# Patient Record
Sex: Male | Born: 1935 | Race: Black or African American | Hispanic: No | Marital: Married | Smoking: Former smoker
Health system: Southern US, Community
[De-identification: ages and names within clinical notes are randomized; demographics above are authoritative.]

## PROBLEM LIST (undated history)

## (undated) DIAGNOSIS — T7840XA Allergy, unspecified, initial encounter: Secondary | ICD-10-CM

## (undated) DIAGNOSIS — K219 Gastro-esophageal reflux disease without esophagitis: Secondary | ICD-10-CM

## (undated) DIAGNOSIS — E785 Hyperlipidemia, unspecified: Secondary | ICD-10-CM

## (undated) DIAGNOSIS — I10 Essential (primary) hypertension: Secondary | ICD-10-CM

## (undated) DIAGNOSIS — I2510 Atherosclerotic heart disease of native coronary artery without angina pectoris: Secondary | ICD-10-CM

## (undated) DIAGNOSIS — E119 Type 2 diabetes mellitus without complications: Secondary | ICD-10-CM

## (undated) HISTORY — DX: Allergy, unspecified, initial encounter: T78.40XA

## (undated) HISTORY — DX: Type 2 diabetes mellitus without complications: E11.9

## (undated) HISTORY — DX: Essential (primary) hypertension: I10

## (undated) HISTORY — DX: Hyperlipidemia, unspecified: E78.5

## (undated) HISTORY — DX: Atherosclerotic heart disease of native coronary artery without angina pectoris: I25.10

## (undated) HISTORY — DX: Gastro-esophageal reflux disease without esophagitis: K21.9

## (undated) HISTORY — PX: MRI: SHX5353

---

## 2000-09-15 HISTORY — PX: CORONARY ARTERY BYPASS GRAFT: SHX141

## 2001-09-15 HISTORY — PX: OTHER SURGICAL HISTORY: SHX169

## 2002-03-22 ENCOUNTER — Encounter: Payer: Self-pay | Admitting: Family Medicine

## 2006-09-02 ENCOUNTER — Ambulatory Visit: Payer: Self-pay | Admitting: Family Medicine

## 2006-09-22 ENCOUNTER — Ambulatory Visit: Payer: Self-pay | Admitting: Family Medicine

## 2006-12-02 ENCOUNTER — Ambulatory Visit: Payer: Self-pay | Admitting: Family Medicine

## 2006-12-02 LAB — CONVERTED CEMR LAB
AST: 20 units/L (ref 0–37)
Hgb A1c MFr Bld: 6.5 % — ABNORMAL HIGH (ref 4.6–6.0)
Microalb Creat Ratio: 2.6 mg/g (ref 0.0–30.0)

## 2007-04-27 ENCOUNTER — Encounter: Payer: Self-pay | Admitting: Family Medicine

## 2007-04-27 DIAGNOSIS — K219 Gastro-esophageal reflux disease without esophagitis: Secondary | ICD-10-CM

## 2007-04-27 DIAGNOSIS — E119 Type 2 diabetes mellitus without complications: Secondary | ICD-10-CM | POA: Insufficient documentation

## 2007-04-27 DIAGNOSIS — I10 Essential (primary) hypertension: Secondary | ICD-10-CM | POA: Insufficient documentation

## 2007-04-27 DIAGNOSIS — E785 Hyperlipidemia, unspecified: Secondary | ICD-10-CM | POA: Insufficient documentation

## 2007-04-27 DIAGNOSIS — J309 Allergic rhinitis, unspecified: Secondary | ICD-10-CM | POA: Insufficient documentation

## 2007-04-27 DIAGNOSIS — I2510 Atherosclerotic heart disease of native coronary artery without angina pectoris: Secondary | ICD-10-CM | POA: Insufficient documentation

## 2007-04-27 DIAGNOSIS — E669 Obesity, unspecified: Secondary | ICD-10-CM

## 2007-05-03 ENCOUNTER — Ambulatory Visit: Payer: Self-pay | Admitting: Family Medicine

## 2007-05-03 LAB — CONVERTED CEMR LAB: Cholesterol, target level: 200 mg/dL

## 2007-05-06 ENCOUNTER — Ambulatory Visit: Payer: Self-pay | Admitting: Family Medicine

## 2007-05-06 LAB — CONVERTED CEMR LAB
ALT: 13 units/L (ref 0–53)
AST: 17 units/L (ref 0–37)
Albumin: 3.9 g/dL (ref 3.5–5.2)
Calcium: 9.2 mg/dL (ref 8.4–10.5)
Chloride: 102 meq/L (ref 96–112)
GFR calc non Af Amer: 48 mL/min
Hgb A1c MFr Bld: 7 % — ABNORMAL HIGH (ref 4.6–6.0)
LDL Cholesterol: 62 mg/dL (ref 0–99)
VLDL: 16 mg/dL (ref 0–40)

## 2007-05-12 ENCOUNTER — Encounter: Payer: Self-pay | Admitting: Family Medicine

## 2007-06-15 ENCOUNTER — Encounter: Payer: Self-pay | Admitting: Family Medicine

## 2007-07-30 ENCOUNTER — Ambulatory Visit: Payer: Self-pay | Admitting: Family Medicine

## 2007-08-03 ENCOUNTER — Ambulatory Visit: Payer: Self-pay | Admitting: Family Medicine

## 2007-08-03 DIAGNOSIS — N189 Chronic kidney disease, unspecified: Secondary | ICD-10-CM

## 2007-09-13 ENCOUNTER — Encounter: Payer: Self-pay | Admitting: Family Medicine

## 2007-11-03 ENCOUNTER — Ambulatory Visit: Payer: Self-pay | Admitting: Family Medicine

## 2007-11-09 ENCOUNTER — Ambulatory Visit: Payer: Self-pay | Admitting: Family Medicine

## 2007-11-10 LAB — CONVERTED CEMR LAB
ALT: 15 units/L (ref 0–53)
AST: 22 units/L (ref 0–37)
Bilirubin, Direct: 0.2 mg/dL (ref 0.0–0.3)
CO2: 25 meq/L (ref 19–32)
Calcium: 9 mg/dL (ref 8.4–10.5)
Chloride: 102 meq/L (ref 96–112)
GFR calc non Af Amer: 69 mL/min
Glucose, Bld: 151 mg/dL — ABNORMAL HIGH (ref 70–99)
Hgb A1c MFr Bld: 6.3 % — ABNORMAL HIGH (ref 4.6–6.0)

## 2007-11-17 ENCOUNTER — Encounter: Payer: Self-pay | Admitting: Family Medicine

## 2008-02-01 ENCOUNTER — Ambulatory Visit: Payer: Self-pay | Admitting: Family Medicine

## 2008-02-01 LAB — CONVERTED CEMR LAB: Hgb A1c MFr Bld: 6 % (ref 4.6–6.0)

## 2008-02-03 ENCOUNTER — Ambulatory Visit: Payer: Self-pay | Admitting: Family Medicine

## 2008-02-16 ENCOUNTER — Ambulatory Visit: Payer: Self-pay | Admitting: Internal Medicine

## 2008-05-03 ENCOUNTER — Ambulatory Visit: Payer: Self-pay | Admitting: Family Medicine

## 2008-05-05 LAB — CONVERTED CEMR LAB
ALT: 17 units/L (ref 0–53)
Albumin: 3.7 g/dL (ref 3.5–5.2)
BUN: 14 mg/dL (ref 6–23)
CO2: 29 meq/L (ref 19–32)
Calcium: 9.3 mg/dL (ref 8.4–10.5)
Cholesterol: 126 mg/dL (ref 0–200)
Creatinine, Ser: 1.3 mg/dL (ref 0.4–1.5)
Creatinine,U: 167.9 mg/dL
Glucose, Bld: 171 mg/dL — ABNORMAL HIGH (ref 70–99)
HDL: 52.6 mg/dL (ref >39.0)
Microalb, Ur: 0.3 mg/dL (ref 0.0–1.9)
Total CHOL/HDL Ratio: 2.4
Total Protein: 7.3 g/dL (ref 6.0–8.3)
Triglycerides: 83 mg/dL (ref 0–149)

## 2008-05-09 ENCOUNTER — Ambulatory Visit: Payer: Self-pay | Admitting: Family Medicine

## 2008-07-13 ENCOUNTER — Ambulatory Visit: Payer: Self-pay | Admitting: Family Medicine

## 2008-08-08 ENCOUNTER — Ambulatory Visit: Payer: Self-pay | Admitting: Family Medicine

## 2008-08-15 ENCOUNTER — Ambulatory Visit: Payer: Self-pay | Admitting: Family Medicine

## 2008-09-11 ENCOUNTER — Telehealth: Payer: Self-pay | Admitting: Family Medicine

## 2008-10-12 ENCOUNTER — Telehealth: Payer: Self-pay | Admitting: Family Medicine

## 2008-11-24 ENCOUNTER — Telehealth: Payer: Self-pay | Admitting: Family Medicine

## 2008-11-30 ENCOUNTER — Telehealth: Payer: Self-pay | Admitting: Family Medicine

## 2008-12-14 ENCOUNTER — Telehealth: Payer: Self-pay | Admitting: Family Medicine

## 2009-02-14 ENCOUNTER — Ambulatory Visit: Payer: Self-pay | Admitting: Family Medicine

## 2009-02-14 LAB — CONVERTED CEMR LAB
AST: 20 units/L (ref 0–37)
Alkaline Phosphatase: 51 units/L (ref 39–117)
Calcium: 8.9 mg/dL (ref 8.4–10.5)
GFR calc non Af Amer: 54.17 mL/min (ref >60)
HDL: 66.9 mg/dL (ref >39.00)
Hgb A1c MFr Bld: 5.6 % (ref 4.6–6.5)
LDL Cholesterol: 54 mg/dL (ref 0–99)
Potassium: 4.1 meq/L (ref 3.5–5.1)
Sodium: 143 meq/L (ref 135–145)
Total Bilirubin: 0.9 mg/dL (ref 0.3–1.2)
VLDL: 15.6 mg/dL (ref 0.0–40.0)

## 2009-02-19 ENCOUNTER — Ambulatory Visit: Payer: Self-pay | Admitting: Family Medicine

## 2009-02-19 LAB — CONVERTED CEMR LAB
Bilirubin Urine: NEGATIVE
Blood in Urine, dipstick: NEGATIVE
Ketones, urine, test strip: NEGATIVE
Microalb Creat Ratio: 6.5 mg/g (ref 0.0–30.0)
Nitrite: NEGATIVE
Urobilinogen, UA: 0.2
pH: 7.5

## 2009-03-07 ENCOUNTER — Ambulatory Visit: Payer: Self-pay | Admitting: Family Medicine

## 2009-03-14 LAB — CONVERTED CEMR LAB
BUN: 19 mg/dL (ref 6–23)
CO2: 26 meq/L (ref 19–32)
Calcium: 8.8 mg/dL (ref 8.4–10.5)
Chloride: 105 meq/L (ref 96–112)
Creatinine, Ser: 1.8 mg/dL — ABNORMAL HIGH (ref 0.4–1.5)

## 2009-04-13 ENCOUNTER — Ambulatory Visit: Payer: Self-pay | Admitting: Family Medicine

## 2009-04-13 LAB — CONVERTED CEMR LAB
Albumin: 3.8 g/dL (ref 3.5–5.2)
BUN: 19 mg/dL (ref 6–23)
Calcium: 9.1 mg/dL (ref 8.4–10.5)
Creatinine, Ser: 1.8 mg/dL — ABNORMAL HIGH (ref 0.4–1.5)
Glucose, Bld: 122 mg/dL — ABNORMAL HIGH (ref 70–99)
Phosphorus: 3.5 mg/dL (ref 2.3–4.6)
Potassium: 3.9 meq/L (ref 3.5–5.1)

## 2009-07-27 ENCOUNTER — Telehealth: Payer: Self-pay | Admitting: Family Medicine

## 2009-07-27 DIAGNOSIS — F1010 Alcohol abuse, uncomplicated: Secondary | ICD-10-CM | POA: Insufficient documentation

## 2009-08-29 ENCOUNTER — Ambulatory Visit: Payer: Self-pay | Admitting: Family Medicine

## 2009-08-30 LAB — CONVERTED CEMR LAB
ALT: 12 units/L (ref 0–53)
BUN: 15 mg/dL (ref 6–23)
Basophils Absolute: 0 10*3/uL (ref 0.0–0.1)
Bilirubin, Direct: 0.1 mg/dL (ref 0.0–0.3)
Calcium: 9.3 mg/dL (ref 8.4–10.5)
Chloride: 102 meq/L (ref 96–112)
Cholesterol: 161 mg/dL (ref 0–200)
Creatinine, Ser: 1.5 mg/dL (ref 0.4–1.5)
Eosinophils Absolute: 0.3 10*3/uL (ref 0.0–0.7)
GFR calc non Af Amer: 58.27 mL/min (ref >60)
HDL: 75.8 mg/dL (ref >39.00)
Hemoglobin: 11.9 g/dL — ABNORMAL LOW (ref 13.0–17.0)
Hgb A1c MFr Bld: 5.5 % (ref 4.6–6.5)
LDL Cholesterol: 71 mg/dL (ref 0–99)
Lymphocytes Relative: 27.4 % (ref 12.0–46.0)
Lymphs Abs: 1.6 10*3/uL (ref 0.7–4.0)
MCHC: 35.5 g/dL (ref 30.0–36.0)
Neutro Abs: 3.5 10*3/uL (ref 1.4–7.7)
Platelets: 212 10*3/uL (ref 150.0–400.0)
RDW: 12 % (ref 11.5–14.6)
Total Bilirubin: 0.8 mg/dL (ref 0.3–1.2)
Total CHOL/HDL Ratio: 2
Triglycerides: 69 mg/dL (ref 0.0–149.0)

## 2009-09-13 ENCOUNTER — Ambulatory Visit: Payer: Self-pay | Admitting: Family Medicine

## 2009-09-18 ENCOUNTER — Ambulatory Visit: Payer: Self-pay | Admitting: Family Medicine

## 2009-09-27 ENCOUNTER — Ambulatory Visit: Payer: Self-pay | Admitting: Internal Medicine

## 2009-12-07 ENCOUNTER — Ambulatory Visit: Payer: Self-pay | Admitting: Family Medicine

## 2009-12-11 ENCOUNTER — Ambulatory Visit: Payer: Self-pay | Admitting: Family Medicine

## 2010-01-21 ENCOUNTER — Telehealth: Payer: Self-pay | Admitting: Family Medicine

## 2010-06-14 ENCOUNTER — Ambulatory Visit: Payer: Self-pay | Admitting: Family Medicine

## 2010-06-19 LAB — CONVERTED CEMR LAB
Albumin: 4.2 g/dL (ref 3.5–5.2)
BUN: 16 mg/dL (ref 6–23)
Calcium: 9.3 mg/dL (ref 8.4–10.5)
Cholesterol: 182 mg/dL (ref 0–200)
GFR calc non Af Amer: 72.44 mL/min (ref >60)
Glucose, Bld: 151 mg/dL — ABNORMAL HIGH (ref 70–99)
HDL: 91.8 mg/dL (ref >39.00)
LDL Cholesterol: 75 mg/dL (ref 0–99)
Sodium: 139 meq/L (ref 135–145)
Total Bilirubin: 1 mg/dL (ref 0.3–1.2)
VLDL: 14.8 mg/dL (ref 0.0–40.0)

## 2010-07-02 ENCOUNTER — Telehealth: Payer: Self-pay | Admitting: Family Medicine

## 2010-07-02 ENCOUNTER — Emergency Department: Payer: Self-pay | Admitting: Emergency Medicine

## 2010-07-05 ENCOUNTER — Ambulatory Visit: Payer: Self-pay | Admitting: Internal Medicine

## 2010-07-05 DIAGNOSIS — R55 Syncope and collapse: Secondary | ICD-10-CM

## 2010-10-15 NOTE — Assessment & Plan Note (Signed)
Summary: FLU SHOT/[facility]/CLE  Nurse Visit   Vital Signs:  Patient profile:   77 year old male Height:      66 inches Weight:      197 pounds  Vitals Entered By: Benny Lennert CMA (AAMA) (September 18, 2009 3:00 PM)  Allergies: 1)  ! * Blood Thinner  Orders Added: 1)  Admin 1st Vaccine [90471] 2)  Flu Vaccine 37yrs + [04540]    Flu Vaccine Consent Questions     Do you have a history of severe allergic reactions to this vaccine? no    Any prior history of allergic reactions to egg and/or gelatin? no    Do you have a sensitivity to the preservative Thimersol? no    Do you have a past history of Guillan-Barre Syndrome? no    Do you currently have an acute febrile illness? no    Have you ever had a severe reaction to latex? no    Vaccine information given and explained to patient? yes    Are you currently pregnant? no    Lot Number:[id number]   Exp Date:03/14/2010   Site Given  Left Deltoid IMu

## 2010-10-15 NOTE — Assessment & Plan Note (Signed)
Summary: rov   Visit Type:  Follow-up Primary Provider:  [name] MD  CC:  no complaints.  History of Present Illness: [name] is a delightful 77 year old male with a history of coronary artery disease.  He is status post bypass surgery in 2002 at [facility].  Also has a history of diabetes, hypertension and hyperlipidemia.  Had a stress Myoview in 2010 which was negative.    Returns today fro routine f/u. Doing well. Walking 1.5 miles a day (weather permitting) No CP or dyspnea. BP well controlled. Taking all medications without problems. Has cut way back on ETOH use.   Current Problems (verified): 1)  Alcohol Abuse  (ICD-305.00) 2)  Renal Insufficiency, Chronic  (ICD-585.9) 3)  Obesity  (ICD-278.00) 4)  Hypertension  (ICD-401.9) 5)  Hyperlipidemia  (ICD-272.4) 6)  Gerd  (ICD-530.81) 7)  Diabetes Mellitus, Type II  (ICD-250.00) 8)  Coronary Artery Disease  (ICD-414.00) 9)  Allergic Rhinitis  (ICD-477.9)  Current Medications (verified): 1)  Atenolol 50 Mg  Tabs (Atenolol) .... Take 1 Tablet By Mouth Two Times A Day 2)  Norvasc 5 Mg Tabs (Amlodipine Besylate) .... Take 1 Tablet By Mouth Once A Day 3)  Hydrochlorothiazide 25 Mg  Tabs (Hydrochlorothiazide) .... Take 1 Tablet By Mouth Once A Day 4)  Glipizide 5 Mg  Tb24 (Glipizide) .... Take 1 Tablet By Mouth Two Times A Day 5)  Simvastatin 40 Mg  Tabs (Simvastatin) .... Take 1 Tablet By Mouth Once A Day 6)  Isosorbide Dinitrate 10 Mg  Tabs (Isosorbide Dinitrate) .... Take 1 Tablet By Mouth Three Times A Day 7)  Adult Aspirin Ec Low Strength 81 Mg  Tbec (Aspirin) .... Once Daily 8)  [facility] Spectravite Senior   Tabs (Multiple Vitamins-Minerals) .Marland Kitchen.. 1 X Week 9)  Nexium 40 Mg Cpdr (Esomeprazole Magnesium) .... Take 1 Tablet By Mouth Once A Day 10)  Cozaar 100 Mg Tabs (Losartan Potassium) .... Take 1 Tablet By Mouth Once A Day  Allergies (verified): 1)  ! * Blood Thinner 2)  ! Metformin Hcl \  Past History:  Past Medical  History: Last updated: 04/27/2007 Allergic rhinitis Coronary artery disease Diabetes mellitus, type II GERD Hyperlipidemia Hypertension  Vital Signs:  Patient profile:   77 year old male Weight:      192.25 pounds Pulse rate:   66 / minute Pulse rhythm:   regular BP sitting:   116 / 62  (right arm)  Vitals Entered By: Charlena Cross, RN, BSN (September 27, 2009 3:56 PM)  Physical Exam  General:  Gen: well appearing. no resp difficulty HEENT: normal Neck: supple. no JVD. Carotids 2+ bilat; no bruits. No lymphadenopathy or thryomegaly appreciated. Cor: PMI nondisplaced. Regular rate & rhythm. No rubs, gallops, murmur. Lungs: clear Abdomen: soft, nontender, nondistended. Good bowel sounds. Extremities: no cyanosis, clubbing, rash, edema Neuro: alert & orientedx3, cranial nerves grossly intact. moves all 4 extremities w/o difficulty. affect pleasant    Impression & Recommendations:  Problem # 1:  CORONARY ARTERY DISEASE (ICD-414.00) Stable. No evidence of ischemia. Continue current regimen.  Problem # 2:  HYPERTENSION (ICD-401.9) Blood pressure well controlled. Continue current regimen.  Problem # 3:  HYPERLIPIDEMIA (ICD-272.4) Followed by Dr. Ermalene Searing. Goal LDL < 70. Continue simvastatin.

## 2010-10-15 NOTE — Progress Notes (Signed)
Summary: Prior Authorization Norvasc  Phone Note From Pharmacy Call back at ph [phone] fax [phone]   Caller: [facility] [address] [phone]* Call For: Dr. Ermalene Searing  Summary of Call: Received faxed form from pharmacy stating that PA is needed for Norvasc 5mg .  Called [employer] [facility] at [phone] and requested a PA form via automated voice system.  Linde Gillis CMA Duncan Dull)  Jan 21, 2010 9:36 AM   PA forms in your IN box.   Initial call taken by: Linde Gillis CMA Duncan Dull),  Jan 21, 2010 11:01 AM  Follow-up for Phone Call        Done Follow-up by: [name] MD,  Jan 22, 2010 8:09 AM     Appended Document: Prior Authorization Norvasc Received Denial for Norvasc.  Denial letter in your IN box.

## 2010-10-15 NOTE — Assessment & Plan Note (Signed)
Summary: ROV/AMD   Visit Type:  Follow-up Primary [facility] [place]:  [facility]  CC:  c/o breaking out in a sweat after eating on 06/25/10.  Denies chest pain or shortness of breath. He loss his balance with feeling weak. He went to the ED at [facility] for evaluation.[facility]  History of Present Illness: [name] is a delightful 77 year old male with a history of coronary artery disease.  He is status post bypass surgery in 2002 at [facility].  Also has a history of diabetes, hypertension and hyperlipidemia.  Had a stress Myoview in 2010 which was negative.    Returns today for routine f/u. Tuesday was in [place] with wife and began sweating and fell to floor. Denies CP or palpitations. No LOC. Went to ER. No apparent cause.    Doing well. Walking 1.5 miles a day (weather permitting) No CP or dyspnea. No palpitations.  BP well controlled. Denies orthostasis.  Taking all medications without problems. Has cut way back on ETOH use.   Current Medications (verified): 1)  Atenolol 50 Mg  Tabs (Atenolol) .... Take 1 Tablet By Mouth Two Times A Day 2)  Norvasc 5 Mg Tabs (Amlodipine Besylate) .... Take 1 Tablet By Mouth Once A Day 3)  Hydrochlorothiazide 25 Mg  Tabs (Hydrochlorothiazide) .... Take 1 Tablet By Mouth Once A Day 4)  Simvastatin 40 Mg  Tabs (Simvastatin) .... Take 1 Tablet By Mouth Once A Day 5)  Isosorbide Dinitrate 10 Mg  Tabs (Isosorbide Dinitrate) .... Take 1 Tablet By Mouth Three Times A Day 6)  [facility]vs Spectravite Senior   Tabs (Multiple Vitamins-Minerals) .Tom [facility].. 1 X Week 7)  Nexium 40 Mg Cpdr (Esomeprazole Magnesium) .... Take 1 Tablet By Mouth Once A Day 8)  Cozaar 100 Mg Tabs (Losartan Potassium) .... Take 1 Tablet By Mouth Once A Day 9)  Prodigy Autocode Blood Glucose  Devi (Blood Glucose Monitoring Suppl) .Tom [facility].. 1 Device. Check Cbgs Daily. Dx 250.00 10)  Prodigy Blood Glucose Test  Strp (Glucose Blood) .... Check Blood Sugar Daily. Dx 250.00 11)  Aspirin 325 Mg Tabs (Aspirin) .... One Tablet Once  Daily 12)  Cyclobenzaprine Hcl 5 Mg Tabs (Cyclobenzaprine Hcl) .... One Tablet Three Times A Day  Allergies (verified): 1)  ! * Blood Thinner 2)  ! Metformin Hcl  Past History:  Past Medical History: Last updated: 05/12/2007 Allergic rhinitis Coronary artery disease Diabetes mellitus, type II GERD Hyperlipidemia Hypertension  Past Surgical History: Last updated: May 12, 2007 2002   Triple CABG 2003    Cataract Surgery             MRI:  tooth abscess  Family History: Last updated: 2007-05-12 Father: Died 21. HTN, CAD, high chol. Mother: Died 18, arthritis, HTN Siblings: 2 brothers CAD, 1 sister CAD - MI age 18, another sister DM - MI age 25  Social History: Last updated: 12-May-2007 Former Smoker, quit 6 years ago Alcohol use-yes, occasionally Regular exercise-yes, walks daily, 1 mile Marital Status: Married x 51 years Children: 39 years old, healthy Occupation: Retired Paediatric nurse, farming Diet:  (+) fruit and veggies, (+) H2O  Risk Factors: Alcohol Use: <1 (09/13/2009) Exercise: yes (2007-05-12)  Risk Factors: Smoking Status: quit > 6 months (09/13/2009)  Review of Systems       As per HPI and past medical history; otherwise all systems negative.   Vital Signs:  Patient profile:   77 year old male Height:      66 inches Weight:      185 pounds  BMI:     29.97 Pulse rate:   67 / minute BP sitting:   100 / 62  (left arm) Cuff size:   regular  Vitals Entered By: Bishop Dublin, CMA (July 05, 2010 3:39 PM)  Physical Exam  General:  elderly well appearing. no resp difficulty HEENT:  except for poor dentition Neck: supple. no JVD. Carotids 2+ bilat; no bruits. No lymphadenopathy or thryomegaly appreciated. Cor: PMI nondisplaced. Regular rate & rhythm. No rubs, gallops, murmur. Lungs: clear Abdomen: soft, nontender, nondistended. Good bowel sounds. Extremities: no cyanosis, clubbing, rash, edema Neuro: alert & orientedx3, cranial nerves grossly  intact. moves all 4 extremities w/o difficulty. affect pleasant   Impression & Recommendations:  Problem # 1:  CORONARY ARTERY DISEASE (ICD-414.00) Stable. No evidence of ischemia. Continue current regimen.  Problem # 2:  PRESYNCOPE AND COLLAPSE (ICD-780.2) Suspect he may have been hypoglycemic vs arrhythmia. If episode recurs will need event monitor.   Patient Instructions: 1)  Your physician recommends that you continue on your current medications as directed. Please refer to the Current Medication list given to you today. 2)  Your physician wants you to follow-up in:   6 months You will receive a reminder letter in the mail two months in advance. If you don't receive a letter, please call our office to schedule the follow-up appointment.

## 2010-10-15 NOTE — Progress Notes (Signed)
Summary: pt fell  Phone Note Call from Patient   Caller: Daughter Dois Davenport Call For: Tom Nora MD Summary of Call: Daughter states pt fell a few minutes ago and is complaining of pain in his back and hips.  Advised her that pt should go to ER to be evaluated. Initial call taken by: Lowella Petties CMA,  July 02, 2010 2:36 PM

## 2010-10-15 NOTE — Assessment & Plan Note (Signed)
Summary: 3 month follow up/rbh   Vital Signs:  Patient profile:   77 year old male Height:      66 inches Weight:      193.2 pounds BMI:     31.30 Temp:     97.7 degrees F oral Pulse rate:   66 / minute Pulse rhythm:   regular BP sitting:   120 / 78  (left arm) Cuff size:   regular  Vitals Entered By: Benny Lennert CMA Duncan Dull) (December 11, 2009 11:09 AM)  History of Present Illness: Chief complaint 3 month followup also needs new glucose meter  DM, very well controlled. Will stop glipizide.  Rare low CBG <60. ..meter has broken.   Walks daily, moderate diet control. 3 oz of wine a week per patient.  Hypertension History:      He denies headache, chest pain, palpitations, peripheral edema, and side effects from treatment.  Well controlled at home. Marland Kitchen        Positive major cardiovascular risk factors include male age 68 years old or older, diabetes, hyperlipidemia, and hypertension.  Negative major cardiovascular risk factors include non-tobacco-user status.        Positive history for target organ damage include ASHD (either angina/prior MI/prior CABG).     Problems Prior to Update: 1)  Alcohol Abuse  (ICD-305.00) 2)  Renal Insufficiency, Chronic  (ICD-585.9) 3)  Obesity  (ICD-278.00) 4)  Hypertension  (ICD-401.9) 5)  Hyperlipidemia  (ICD-272.4) 6)  Gerd  (ICD-530.81) 7)  Diabetes Mellitus, Type II  (ICD-250.00) 8)  Coronary Artery Disease  (ICD-414.00) 9)  Allergic Rhinitis  (ICD-477.9)  Current Medications (verified): 1)  Atenolol 50 Mg  Tabs (Atenolol) .... Take 1 Tablet By Mouth Two Times A Day 2)  Norvasc 5 Mg Tabs (Amlodipine Besylate) .... Take 1 Tablet By Mouth Once A Day 3)  Hydrochlorothiazide 25 Mg  Tabs (Hydrochlorothiazide) .... Take 1 Tablet By Mouth Once A Day 4)  Simvastatin 40 Mg  Tabs (Simvastatin) .... Take 1 Tablet By Mouth Once A Day 5)  Isosorbide Dinitrate 10 Mg  Tabs (Isosorbide Dinitrate) .... Take 1 Tablet By Mouth Three Times A Day 6)  Adult  Aspirin Ec Low Strength 81 Mg  Tbec (Aspirin) .... Once Daily 7)  [facility]vs Spectravite Senior   Tabs (Multiple Vitamins-Minerals) .Marland Kitchen.. 1 X Week 8)  Nexium 40 Mg Cpdr (Esomeprazole Magnesium) .... Take 1 Tablet By Mouth Once A Day 9)  Cozaar 100 Mg Tabs (Losartan Potassium) .... Take 1 Tablet By Mouth Once A Day 10)  Prodigy Autocode Blood Glucose  Devi (Blood Glucose Monitoring Suppl) .Marland Kitchen.. 1 Device. Check Cbgs Daily. Dx 250.00 11)  Prodigy Blood Glucose Test  Strp (Glucose Blood) .... Check Blood Sugar Daily. Dx 250.00  Allergies: 1)  ! * Blood Thinner 2)  ! Metformin Hcl  Past History:  Past medical, surgical, family and social histories (including risk factors) reviewed, and no changes noted (except as noted below).  Past Medical History: Reviewed history from 04/27/2007 and no changes required. Allergic rhinitis Coronary artery disease Diabetes mellitus, type II GERD Hyperlipidemia Hypertension  Past Surgical History: Reviewed history from 04/27/2007 and no changes required. 2002   Triple CABG 2003    Cataract Surgery             MRI:  tooth abscess  Family History: Reviewed history from 04/27/2007 and no changes required. Father: Died 29. HTN, CAD, high chol. Mother: Died 41, arthritis, HTN Siblings: 2 brothers CAD, 1 sister CAD -  MI age 39, another sister DM - MI age 52  Social History: Reviewed history from 04/27/2007 and no changes required. Former Smoker, quit 6 years ago Alcohol use-yes, occasionally Regular exercise-yes, walks daily, 1 mile Marital Status: Married x 51 years Children: 82 years old, healthy Occupation: Retired Paediatric nurse, farming Diet:  (+) fruit and veggies, (+) H2O  Review of Systems General:  Denies fatigue and fever. CV:  Denies chest pain or discomfort. Resp:  Denies shortness of breath. GI:  Denies abdominal pain. GU:  Denies dysuria.  Physical Exam  General:  overweight appearing male in NAD Mouth:  Oral mucosa and oropharynx  without lesions or exudates.  Teeth in good repair. Neck:  no carotid bruit or thyromegaly no cervical or supraclavicular lymphadenopathy  Lungs:  Normal respiratory effort, chest expands symmetrically. Lungs are clear to auscultation, no crackles or wheezes. Heart:  Distant heart sounds. Normal rate and regular rhythm. S1 and S2 normal without gallop, murmur, click, rub or other extra sounds. Abdomen:  Bowel sounds positive,abdomen soft and non-tender without masses, organomegaly or hernias noted. Pulses:  R and L posterior tibial pulses are full and equal bilaterally  Extremities:  no edema  Diabetes Management Exam:    Foot Exam (with socks and/or shoes not present):       Sensory-Pinprick/Light touch:          Left medial foot (L-4): normal          Left dorsal foot (L-5): normal          Left lateral foot (S-1): normal          Right medial foot (L-4): normal          Right dorsal foot (L-5): normal          Right lateral foot (S-1): normal       Sensory-Monofilament:          Left foot: normal          Right foot: normal       Inspection:          Left foot: normal          Right foot: normal       Nails:          Left foot: thickened          Right foot: thickened   Impression & Recommendations:  Problem # 1:  HYPERTENSION (ICD-401.9) Well controlled. Continue current medication.  His updated medication list for this problem includes:    Atenolol 50 Mg Tabs (Atenolol) .Marland Kitchen... Take 1 tablet by mouth two times a day    Norvasc 5 Mg Tabs (Amlodipine besylate) .Marland Kitchen... Take 1 tablet by mouth once a day    Hydrochlorothiazide 25 Mg Tabs (Hydrochlorothiazide) .Marland Kitchen... Take 1 tablet by mouth once a day    Cozaar 100 Mg Tabs (Losartan potassium) .Marland Kitchen... Take 1 tablet by mouth once a day  Problem # 2:  DIABETES MELLITUS, TYPE II (ICD-250.00) Very well controlled.Marland Kitchenstop glipizide.  The following medications were removed from the medication list:    Glipizide 5 Mg Tb24 (Glipizide) .Marland Kitchen...  Take 1 tablet by mouth two times a day His updated medication list for this problem includes:    Adult Aspirin Ec Low Strength 81 Mg Tbec (Aspirin) ..... Once daily    Cozaar 100 Mg Tabs (Losartan potassium) .Marland Kitchen... Take 1 tablet by mouth once a day  Problem # 3:  CORONARY ARTERY DISEASE (ICD-414.00) Stable per cards.  His updated  medication list for this problem includes:    Atenolol 50 Mg Tabs (Atenolol) .Marland Kitchen... Take 1 tablet by mouth two times a day    Norvasc 5 Mg Tabs (Amlodipine besylate) .Marland Kitchen... Take 1 tablet by mouth once a day    Hydrochlorothiazide 25 Mg Tabs (Hydrochlorothiazide) .Marland Kitchen... Take 1 tablet by mouth once a day    Isosorbide Dinitrate 10 Mg Tabs (Isosorbide dinitrate) .Marland Kitchen... Take 1 tablet by mouth three times a day    Adult Aspirin Ec Low Strength 81 Mg Tbec (Aspirin) ..... Once daily    Cozaar 100 Mg Tabs (Losartan potassium) .Marland Kitchen... Take 1 tablet by mouth once a day  Complete Medication List: 1)  Atenolol 50 Mg Tabs (Atenolol) .... Take 1 tablet by mouth two times a day 2)  Norvasc 5 Mg Tabs (Amlodipine besylate) .... Take 1 tablet by mouth once a day 3)  Hydrochlorothiazide 25 Mg Tabs (Hydrochlorothiazide) .... Take 1 tablet by mouth once a day 4)  Simvastatin 40 Mg Tabs (Simvastatin) .... Take 1 tablet by mouth once a day 5)  Isosorbide Dinitrate 10 Mg Tabs (Isosorbide dinitrate) .... Take 1 tablet by mouth three times a day 6)  Adult Aspirin Ec Low Strength 81 Mg Tbec (Aspirin) .... Once daily 7)  [facility]vs Spectravite Senior Tabs (Multiple vitamins-minerals) .Marland Kitchen.. 1 x week 8)  Nexium 40 Mg Cpdr (Esomeprazole magnesium) .... Take 1 tablet by mouth once a day 9)  Cozaar 100 Mg Tabs (Losartan potassium) .... Take 1 tablet by mouth once a day 10)  Prodigy Autocode Blood Glucose Devi (Blood glucose monitoring suppl) .Marland Kitchen.. 1 device. check cbgs daily. dx 250.00 11)  Prodigy Blood Glucose Test Strp (Glucose blood) .... Check blood sugar daily. dx 250.00  Hypertension Assessment/Plan:       The patient's hypertensive risk group is category C: Target organ damage and/or diabetes.  Today's blood pressure is 120/78.  His blood pressure goal is < 130/80.  Patient Instructions: 1)  Stop glipizide.  2)  Follow Blood sugars and BP at home. Call if blood sugar trending up. 3)  BMP prior to visit, ICD-9: 250.00, 273.2 4)  Hepatic Panel prior to visit ICD-9:  5)  Lipid panel prior to visit ICD-9 :  6)  HgBA1c prior to visit  ICD-9:  7)  Please schedule a follow-up appointment in 6 months DM. Prescriptions: PRODIGY AUTOCODE BLOOD GLUCOSE  DEVI (BLOOD GLUCOSE MONITORING SUPPL) 1 device. Check CBGs daily. Dx 250.00  #1 x 0   Entered and Authorized by:   [name] MD   Signed by:   [name] MD on 12/11/2009   Method used:   Electronically to        [facility]. [address] [phone]* (retail)       [address] [place], [place]  [id number]       Ph: [phone]       Fax: [phone]   RxID:   [id number] PRODIGY BLOOD GLUCOSE TEST  STRP (GLUCOSE BLOOD) Check blood sugar daily. Dx 250.00  #100 x 11   Entered and Authorized by:   [name] MD   Signed by:   [name] MD on 12/11/2009   Method used:   Electronically to        [facility]. [address] [phone]* (retail)       [address] [place], [place]  [id number]       Ph:  [phone]       Fax: [phone]   RxID:   [id number] NORVASC 5 MG TABS (AMLODIPINE BESYLATE) Take 1 tablet by mouth once a day Brand medically necessary #30 Tablet x 11   Entered and Authorized by:   [name] MD   Signed by:   [name] MD on 12/11/2009   Method used:   Electronically to        [facility]. [address] [phone]* (retail)       [address] [place], [place]  [id number]       Ph: [phone]       Fax: [phone]   RxID:   [id number]   Current Allergies (reviewed today): ! * BLOOD THINNER ! METFORMIN HCL

## 2010-10-15 NOTE — Assessment & Plan Note (Signed)
Summary: 6 MTH FU/CLE   Vital Signs:  Patient profile:   77 year old male Height:      66 inches Weight:      180.0 pounds BMI:     29.16 Temp:     98.1 degrees F oral Pulse rate:   66 / minute Pulse rhythm:   regular BP sitting:   120 / 76  (left arm) Cuff size:   regular  Vitals Entered By: Benny Lennert CMA Duncan Dull) (June 14, 2010 8:12 AM)  History of Present Illness: Chief complaint 6 month follow up   DM, well controlled in past..now off all med..due for A1C.  HTn, well controlled on cozaar, HCTZ, norvasc, atenolol.  High  Chol : on simvastatin..Goal LDL <70 due for reeval.  CAD.Marland Kitchenno chest pain.  Sees Cardiology annually...Dr. Derl Barrow. ALst OV 09/2009 Had a stress Myoview in 2010 which was negative.    Walking 1 mile daily HAs lost 13 lbs since last OV..eating helathier foods.   Lipid Management History:      Positive NCEP/ATP III risk factors include male age 14 years old or older, diabetes, hypertension, and ASHD (either angina/prior MI/prior CABG).  Negative NCEP/ATP III risk factors include HDL cholesterol greater than 60 and non-tobacco-user status.        His compliance with the TLC diet is fair.     Problems Prior to Update: 1)  Alcohol Abuse  (ICD-305.00) 2)  Renal Insufficiency, Chronic  (ICD-585.9) 3)  Obesity  (ICD-278.00) 4)  Hypertension  (ICD-401.9) 5)  Hyperlipidemia  (ICD-272.4) 6)  Gerd  (ICD-530.81) 7)  Diabetes Mellitus, Type II  (ICD-250.00) 8)  Coronary Artery Disease  (ICD-414.00) 9)  Allergic Rhinitis  (ICD-477.9)  Current Medications (verified): 1)  Atenolol 50 Mg  Tabs (Atenolol) .... Take 1 Tablet By Mouth Two Times A Day 2)  Norvasc 5 Mg Tabs (Amlodipine Besylate) .... Take 1 Tablet By Mouth Once A Day 3)  Hydrochlorothiazide 25 Mg  Tabs (Hydrochlorothiazide) .... Take 1 Tablet By Mouth Once A Day 4)  Simvastatin 40 Mg  Tabs (Simvastatin) .... Take 1 Tablet By Mouth Once A Day 5)  Isosorbide Dinitrate 10 Mg  Tabs (Isosorbide  Dinitrate) .... Take 1 Tablet By Mouth Three Times A Day 6)  Adult Aspirin Ec Low Strength 81 Mg  Tbec (Aspirin) .... Once Daily 7)  [facility]vs Spectravite Senior   Tabs (Multiple Vitamins-Minerals) .Marland Kitchen.. 1 X Week 8)  Nexium 40 Mg Cpdr (Esomeprazole Magnesium) .... Take 1 Tablet By Mouth Once A Day 9)  Cozaar 100 Mg Tabs (Losartan Potassium) .... Take 1 Tablet By Mouth Once A Day 10)  Prodigy Autocode Blood Glucose  Devi (Blood Glucose Monitoring Suppl) .Marland Kitchen.. 1 Device. Check Cbgs Daily. Dx 250.00 11)  Prodigy Blood Glucose Test  Strp (Glucose Blood) .... Check Blood Sugar Daily. Dx 250.00  Allergies: 1)  ! * Blood Thinner 2)  ! Metformin Hcl  Past History:  Past medical, surgical, family and social histories (including risk factors) reviewed, and no changes noted (except as noted below).  Past Medical History: Reviewed history from 04/27/2007 and no changes required. Allergic rhinitis Coronary artery disease Diabetes mellitus, type II GERD Hyperlipidemia Hypertension  Past Surgical History: Reviewed history from 04/27/2007 and no changes required. 2002   Triple CABG 2003    Cataract Surgery             MRI:  tooth abscess  Family History: Reviewed history from 04/27/2007 and no changes required. Father: Died 21. HTN,  CAD, high chol. Mother: Died 78, arthritis, HTN Siblings: 2 brothers CAD, 1 sister CAD - MI age 60, another sister DM - MI age 66  Social History: Reviewed history from 04/27/2007 and no changes required. Former Smoker, quit 6 years ago Alcohol use-yes, occasionally Regular exercise-yes, walks daily, 1 mile Marital Status: Married x 51 years Children: 42 years old, healthy Occupation: Retired Paediatric nurse, farming Diet:  (+) fruit and veggies, (+) H2O  Review of Systems General:  Denies fatigue and fever. CV:  Denies chest pain or discomfort. Resp:  Denies shortness of breath. GI:  Denies abdominal pain, bloody stools, constipation, and diarrhea. GU:  Denies  dysuria.  Physical Exam  General:  elderly overweight appearing male in NAD Mouth:  Oral mucosa and oropharynx without lesions or exudates.  Teeth in good repair. Neck:  no carotid bruit or thyromegaly no cervical or supraclavicular lymphadenopathy  Lungs:  Normal respiratory effort, chest expands symmetrically. Lungs are clear to auscultation, no crackles or wheezes. Heart:  Distant heart sounds. Normal rate and regular rhythm. S1 and S2 normal without gallop, murmur, click, rub or other extra sounds. Abdomen:  Bowel sounds positive,abdomen soft and non-tender without masses, organomegaly or hernias noted. Pulses:  R and L posterior tibial pulses are full and equal bilaterally  Extremities:  no edema Skin:  Intact without suspicious lesions or rashes Psych:  Cognition and judgment appear intact. Alert and cooperative with normal attention span and concentration. No apparent delusions, illusions, hallucinations  Diabetes Management Exam:    Foot Exam (with socks and/or shoes not present):       Sensory-Pinprick/Light touch:          Left medial foot (L-4): normal          Left dorsal foot (L-5): normal          Left lateral foot (S-1): normal          Right medial foot (L-4): normal          Right dorsal foot (L-5): normal          Right lateral foot (S-1): normal       Sensory-Monofilament:          Left foot: normal          Right foot: normal       Inspection:          Left foot: normal          Right foot: normal       Nails:          Left foot: thickened          Right foot: thickened   Impression & Recommendations:  Problem # 1:  HYPERTENSION (ICD-401.9) Well controlled. Continue current medication.  His updated medication list for this problem includes:    Atenolol 50 Mg Tabs (Atenolol) .Marland Kitchen... Take 1 tablet by mouth two times a day    Norvasc 5 Mg Tabs (Amlodipine besylate) .Marland Kitchen... Take 1 tablet by mouth once a day    Hydrochlorothiazide 25 Mg Tabs (Hydrochlorothiazide)  .Marland Kitchen... Take 1 tablet by mouth once a day    Cozaar 100 Mg Tabs (Losartan potassium) .Marland Kitchen... Take 1 tablet by mouth once a day  BP today: 120/76 Prior BP: 120/78 (12/11/2009)  Prior 10 Yr Risk Heart Disease: N/A (05/03/2007)  Labs Reviewed: K+: 4.0 (08/29/2009) Creat: : 1.5 (08/29/2009)   Chol: 161 (08/29/2009)   HDL: 75.80 (08/29/2009)   LDL: 71 (08/29/2009)   TG: 69.0 (08/29/2009)  Problem # 2:  HYPERLIPIDEMIA (ICD-272.4) Due for reeval.  His updated medication list for this problem includes:    Simvastatin 40 Mg Tabs (Simvastatin) .Marland Kitchen... Take 1 tablet by mouth once a day  Labs Reviewed: SGOT: 19 (08/29/2009)   SGPT: 12 (08/29/2009)  Lipid Goals: Chol Goal: 200 (05/03/2007)   HDL Goal: 40 (05/03/2007)   LDL Goal: 70 (05/03/2007)   TG Goal: 150 (05/03/2007)  Prior 10 Yr Risk Heart Disease: N/A (05/03/2007)   HDL:75.80 (08/29/2009), 66.90 (02/14/2009)  LDL:71 (08/29/2009), 54 (02/14/2009)  Chol:161 (08/29/2009), 136 (02/14/2009)  Trig:69.0 (08/29/2009), 78.0 (02/14/2009)  Problem # 3:  DIABETES MELLITUS, TYPE II (ICD-250.00)  Well controlled  in past..due for reeval. Great job with weight loss.  His updated medication list for this problem includes:    Adult Aspirin Ec Low Strength 81 Mg Tbec (Aspirin) ..... Once daily    Cozaar 100 Mg Tabs (Losartan potassium) .Marland Kitchen... Take 1 tablet by mouth once a day  Orders: TLB-BMP (Basic Metabolic Panel-BMET) (80048-METABOL) TLB-Hepatic/Liver Function Pnl (80076-HEPATIC) TLB-Lipid Panel (80061-LIPID) TLB-A1C / Hgb A1C (Glycohemoglobin) (83036-A1C)  Labs Reviewed: Creat: 1.5 (08/29/2009)     Last Eye Exam: normal (03/15/2008) Reviewed HgBA1c results: 5.1 (12/07/2009)  5.5 (08/29/2009)  Problem # 4:  CORONARY ARTERY DISEASE (ICD-414.00) Refilled isosorbide. COntinue ASA. Assymptomatic. Follow up with Cards 09/2010. His updated medication list for this problem includes:    Atenolol 50 Mg Tabs (Atenolol) .Marland Kitchen... Take 1 tablet by mouth two  times a day    Norvasc 5 Mg Tabs (Amlodipine besylate) .Marland Kitchen... Take 1 tablet by mouth once a day    Hydrochlorothiazide 25 Mg Tabs (Hydrochlorothiazide) .Marland Kitchen... Take 1 tablet by mouth once a day    Isosorbide Dinitrate 10 Mg Tabs (Isosorbide dinitrate) .Marland Kitchen... Take 1 tablet by mouth three times a day    Adult Aspirin Ec Low Strength 81 Mg Tbec (Aspirin) ..... Once daily    Cozaar 100 Mg Tabs (Losartan potassium) .Marland Kitchen... Take 1 tablet by mouth once a day  Complete Medication List: 1)  Atenolol 50 Mg Tabs (Atenolol) .... Take 1 tablet by mouth two times a day 2)  Norvasc 5 Mg Tabs (Amlodipine besylate) .... Take 1 tablet by mouth once a day 3)  Hydrochlorothiazide 25 Mg Tabs (Hydrochlorothiazide) .... Take 1 tablet by mouth once a day 4)  Simvastatin 40 Mg Tabs (Simvastatin) .... Take 1 tablet by mouth once a day 5)  Isosorbide Dinitrate 10 Mg Tabs (Isosorbide dinitrate) .... Take 1 tablet by mouth three times a day 6)  Adult Aspirin Ec Low Strength 81 Mg Tbec (Aspirin) .... Once daily 7)  Cvs Spectravite Senior Tabs (Multiple vitamins-minerals) .Marland Kitchen.. 1 x week 8)  Nexium 40 Mg Cpdr (Esomeprazole magnesium) .... Take 1 tablet by mouth once a day 9)  Cozaar 100 Mg Tabs (Losartan potassium) .... Take 1 tablet by mouth once a day 10)  Prodigy Autocode Blood Glucose Devi (Blood glucose monitoring suppl) .Marland Kitchen.. 1 device. check cbgs daily. dx 250.00 11)  Prodigy Blood Glucose Test Strp (Glucose blood) .... Check blood sugar daily. dx 250.00  Other Orders: Flu Vaccine 83yrs + (45409) Admin 1st Vaccine (81191)  Lipid Assessment/Plan:      Based on NCEP/ATP III, the patient's risk factor category is "history of coronary disease, peripheral vascular disease, cerebrovascular disease, or aortic aneurysm along with either diabetes, current smoker, or LDL > 130 plus HDL < 40 plus triglycerides > 200".  The patient's lipid goals are as follows: Total cholesterol goal is 200;  LDL cholesterol goal is 70; HDL cholesterol  goal is 40; Triglyceride goal is 150.  His LDL cholesterol goal has been met.    Patient Instructions: 1)  Please schedule a follow-up appointment in 6 months  CPX. 2)  BMP prior to visit, ICD-9: 250.00 3)  Hepatic Panel prior to visit ICD-9:  4)  Lipid panel prior to visit ICD-9 :  5)  HgBA1c prior to visit  ICD-9:  6)  Urine Microalbumin prior to visit ICD-9 :  Prescriptions: ISOSORBIDE DINITRATE 10 MG  TABS (ISOSORBIDE DINITRATE) Take 1 tablet by mouth three times a day  #90 Tablet x 3   Entered and Authorized by:   [name] MD   Signed by:   [name] MD on 06/14/2010   Method used:   Electronically to        [facility]. [address] [phone]* (retail)       [address] [place], [place]  [id number]       Ph: [phone]       Fax: [phone]   RxID:   [id number]   Current Allergies (reviewed today): ! * BLOOD THINNER ! METFORMIN HCL   Immunizations Administered:  Influenza Vaccine # 1:    Vaccine Type: Fluvax 3+    Site: left deltoid    Mfr: GlaxoSmithKline    Dose: 0.5 ml    Route: IM    Given by: Benny Lennert CMA (AAMA)    Exp. Date: 03/15/2011    Lot #: [id number]    VIS given: 04/09/10 version given June 14, 2010.  Flu Vaccine Consent Questions:    Do you have a history of severe allergic reactions to this vaccine? no    Any prior history of allergic reactions to egg and/or gelatin? no    Do you have a sensitivity to the preservative Thimersol? no    Do you have a past history of Guillan-Barre Syndrome? no    Do you currently have an acute febrile illness? no    Have you ever had a severe reaction to latex? no    Vaccine information given and explained to patient? yes  Last Flu Vaccine:  Fluvax 3+ (09/18/2009 2:23:53 PM) Flu Vaccine Result Date:  06/14/2010 Flu Vaccine Result:  given Flu Vaccine Next Due:  1 yr PSA Next Due:  Not Indicated

## 2010-10-23 ENCOUNTER — Encounter: Payer: Self-pay | Admitting: Family Medicine

## 2010-10-23 LAB — HM SIGMOIDOSCOPY

## 2010-11-01 ENCOUNTER — Other Ambulatory Visit: Payer: Self-pay | Admitting: Family Medicine

## 2010-11-01 ENCOUNTER — Encounter: Payer: Self-pay | Admitting: Family Medicine

## 2010-11-01 ENCOUNTER — Ambulatory Visit (INDEPENDENT_AMBULATORY_CARE_PROVIDER_SITE_OTHER): Payer: Medicare Other | Admitting: Family Medicine

## 2010-11-01 ENCOUNTER — Ambulatory Visit (INDEPENDENT_AMBULATORY_CARE_PROVIDER_SITE_OTHER)
Admission: RE | Admit: 2010-11-01 | Discharge: 2010-11-01 | Disposition: A | Discharge status: Home / Self Care | Payer: Medicare Other | Source: Ambulatory Visit | Attending: Family Medicine | Admitting: Family Medicine

## 2010-11-01 DIAGNOSIS — R05 Cough: Secondary | ICD-10-CM

## 2010-11-01 DIAGNOSIS — M545 Low back pain, unspecified: Secondary | ICD-10-CM

## 2010-11-01 DIAGNOSIS — E119 Type 2 diabetes mellitus without complications: Secondary | ICD-10-CM

## 2010-11-01 DIAGNOSIS — R059 Cough, unspecified: Secondary | ICD-10-CM

## 2010-11-01 DIAGNOSIS — R5383 Other fatigue: Secondary | ICD-10-CM

## 2010-11-01 DIAGNOSIS — R5381 Other malaise: Secondary | ICD-10-CM

## 2010-11-01 DIAGNOSIS — F1010 Alcohol abuse, uncomplicated: Secondary | ICD-10-CM

## 2010-11-01 DIAGNOSIS — Z79899 Other long term (current) drug therapy: Secondary | ICD-10-CM

## 2010-11-01 LAB — CONVERTED CEMR LAB
Blood in Urine, dipstick: NEGATIVE
Ketones, urine, test strip: NEGATIVE
Urobilinogen, UA: 0.2
WBC Urine, dipstick: NEGATIVE

## 2010-11-01 LAB — BASIC METABOLIC PANEL
Calcium: 9.4 mg/dL (ref 8.4–10.5)
GFR: 66.75 mL/min (ref >60.00)
Glucose, Bld: 138 mg/dL — ABNORMAL HIGH (ref 70–99)
Sodium: 142 mEq/L (ref 135–145)

## 2010-11-01 LAB — HEPATIC FUNCTION PANEL
AST: 19 U/L (ref 0–37)
Alkaline Phosphatase: 56 U/L (ref 39–117)
Total Bilirubin: 0.6 mg/dL (ref 0.3–1.2)

## 2010-11-01 LAB — CBC WITH DIFFERENTIAL/PLATELET
Basophils Absolute: 0 10*3/uL (ref 0.0–0.1)
Hemoglobin: 13.5 g/dL (ref 13.0–17.0)
Lymphocytes Relative: 19.1 % (ref 12.0–46.0)
Monocytes Relative: 8 % (ref 3.0–12.0)
Platelets: 178 10*3/uL (ref 150.0–400.0)
RDW: 13.2 % (ref 11.5–14.6)

## 2010-11-01 LAB — HEMOGLOBIN A1C: Hgb A1c MFr Bld: 6.1 % (ref 4.6–6.5)

## 2010-11-06 NOTE — Assessment & Plan Note (Signed)
Summary: NOT FEELING WELL/CLE   SECURE HORIZONS   Vital Signs:  Patient profile:   77 year old male Weight:      175 pounds Temp:     97.6 degrees F oral Pulse rate:   72 / minute Pulse rhythm:   regular BP sitting:   130 / 78  (right arm) Cuff size:   regular  Vitals Entered By: Lowella Petties CMA, AAMA (November 01, 2010 8:34 AM) CC: Pain across lower back, cough.   History of Present Illness: 77 year old male is status post bypass surgery in 2002 at [facility].  Also has a history of diabetes, hypertension and hyperlipidemia.  Had a stress Myoview in 2010 which was negative.     Syncopal episode in 06/2010... went to [facility].  Had CT scan of head, EKG, labs etc... per pt's daughter everthing was nml.  Saw Dr. Ellis Parents in follow up... he though syncope was either hypoglycemia or ? arrythmia... Recommneded event montior if further issues.  Since then no furthter syncope.   Since then low back pain, off and on... bilateral lumbar spine.  No numbness or tingling in legs.  Walking slower. No weakness in legs or arms Slowed though process since 06/2011.   Cough ongoing in last 1-2 months.  Productive. No ear pain, no fever, no face pain. No smoking... quit 6 years ago...< 25 pack year history. No SOB.   Has lost 10 lbs in last 4 months.   Daughter at [facility] today.. privately says...pt  not eating much, drinking a lot of alcohol ( 1.5 a day),  (pt states only  a little alcohol...2 oz a day) Daguhter works in a lab.. say "he smells like a GI bleed to me." She does not live with pt. Dois Davenport [phone]  Problems Prior to Update: 1)  Weakness  (ICD-780.79) 2)  Cough  (ICD-786.2) 3)  Low Back Pain Syndrome  (ICD-724.2) 4)  Syncope and Collapse  (ICD-780.2) 5)  Alcohol Abuse  (ICD-305.00) 6)  Renal Insufficiency, Chronic  (ICD-585.9) 7)  Obesity  (ICD-278.00) 8)  Hypertension  (ICD-401.9) 9)  Hyperlipidemia  (ICD-272.4) 10)  Gerd  (ICD-530.81) 11)  Diabetes Mellitus, Type II   (ICD-250.00) 12)  Coronary Artery Disease  (ICD-414.00) 13)  Allergic Rhinitis  (ICD-477.9)  Current Medications (verified): 1)  Atenolol 50 Mg  Tabs (Atenolol) .... Take 1 Tablet By Mouth Two Times A Day 2)  Norvasc 5 Mg Tabs (Amlodipine Besylate) .... Take 1 Tablet By Mouth Once A Day 3)  Simvastatin 40 Mg  Tabs (Simvastatin) .... Take 1 Tablet By Mouth Once A Day 4)  Isosorbide Dinitrate 10 Mg  Tabs (Isosorbide Dinitrate) .... Take 1 Tablet By Mouth Three Times A Day 5)  Cvs Spectravite Senior   Tabs (Multiple Vitamins-Minerals) .Marland Kitchen.. 1 X Week 6)  Nexium 40 Mg Cpdr (Esomeprazole Magnesium) .... Take 1 Tablet By Mouth Once A Day 7)  Cozaar 100 Mg Tabs (Losartan Potassium) .... Take 1 Tablet By Mouth Once A Day 8)  Prodigy Autocode Blood Glucose  Devi (Blood Glucose Monitoring Suppl) .Marland Kitchen.. 1 Device. Check Cbgs Daily. Dx 250.00 9)  Prodigy Blood Glucose Test  Strp (Glucose Blood) .... Check Blood Sugar Daily. Dx 250.00 10)  Aspirin 325 Mg Tabs (Aspirin) .... One Tablet Once Daily  Allergies (verified): 1)  ! * Blood Thinner 2)  ! Metformin Hcl  Past History:  Past medical, surgical, family and social histories (including risk factors) reviewed, and no changes noted (except as noted below).  Past Medical History: Reviewed history from 04/27/2007 and no changes required. Allergic rhinitis Coronary artery disease Diabetes mellitus, type II GERD Hyperlipidemia Hypertension  Past Surgical History: Reviewed history from 04/27/2007 and no changes required. 2002   Triple CABG 2003    Cataract Surgery             MRI:  tooth abscess  Family History: Reviewed history from 04/27/2007 and no changes required. Father: Died 76. HTN, CAD, high chol. Mother: Died 42, arthritis, HTN Siblings: 2 brothers CAD, 1 sister CAD - MI age 69, another sister DM - MI age 54  Social History: Reviewed history from 04/27/2007 and no changes required. Former Smoker, quit 6 years ago Alcohol use-yes,  occasionally Regular exercise-yes, walks daily, 1 mile Marital Status: Married x 51 years Children: 59 years old, healthy Occupation: Retired Paediatric nurse, farming Diet:  (+) fruit and veggies, (+) H2O  Review of Systems General:  Denies fatigue and fever. CV:  Denies chest pain or discomfort. Resp:  Denies shortness of breath. GI:  Complains of constipation; denies abdominal pain, bloody stools, and diarrhea. GU:  Denies dysuria and hematuria. Psych:  Denies anxiety and depression.  Physical Exam  General:  ovERWEIHGT ELDERLY MALE in nad  Head:  no maxillary sinus ttp Ears:  External ear exam shows no significant lesions or deformities.  Otoscopic examination reveals clear canals, tympanic membranes are intact bilaterally without bulging, retraction, inflammation or discharge. Hearing is grossly normal bilaterally. Nose:  External nasal examination shows no deformity or inflammation. Nasal mucosa are pink and moist without lesions or exudates. Mouth:  poor dentition Neck:  no cervical or supraclavicular lymphadenopathy no carotid bruit or thyromegaly  Lungs:  Normal respiratory effort, chest expands symmetrically. Lungs are clear to auscultation, no crackles or wheezes. Heart:  Distant heart sounds. Normal rate and regular rhythm. S1 and S2 normal without gallop, murmur, click, rub or other extra sounds. Abdomen:  Bowel sounds positive,abdomen soft and non-tender without masses, organomegaly or hernias noted. Msk:  ttp over B lumbar paraspinous muscles, NO CVA tenderness no vertebral ttp   neg SLR, neg Faber's Pulses:  R and L posterior tibial pulses are full and equal bilaterally  Extremities:  no edema Neurologic:  No cranial nerve deficits noted. Station and gait are normal but slowed gait. Plantar reflexes are down-going bilaterally. DTRs are symmetrical throughout. Sensory, motor and coordinative functions appear intact. Skin:  Intact without suspicious lesions or  rashes Psych:  Oriented X3, normally interactive, good eye contact, not anxious appearing, and not depressed appearing.     Impression & Recommendations:  Problem # 1:  WEAKNESS (ICD-780.79)  And unintentional weight loss.  Nml neuro exam.  Unclear cause. Will eval for blood loss in stool.  Eval with labs in detail as below.   Orders: TLB-BMP (Basic Metabolic Panel-BMET) (80048-METABOL) TLB-CBC Platelet - w/Differential (85025-CBCD) TLB-Hepatic/Liver Function Pnl (80076-HEPATIC) TLB-TSH (Thyroid Stimulating Hormone) (84443-TSH) TLB-B12 + Folate Pnl (16109_60454-U98/JXB)  Problem # 2:  COUGH (ICD-786.2) No current URI symptoms...  but chronic cough with smoking history. Will eval with CXR.  Orders: T-2 View CXR (71020TC)  Problem # 3:  LOW BACK PAIN SYNDROME (ICD-724.2) ? MSK strain vs radiating pain from abdomen.  Will check UA. The following medications were removed from the medication list:    Cyclobenzaprine Hcl 5 Mg Tabs (Cyclobenzaprine hcl) ..... One tablet three times a day His updated medication list for this problem includes:    Aspirin 325 Mg Tabs (Aspirin) ..... One tablet  once daily  Orders: UA Dipstick W/ Micro (manual) (04540)  Problem # 4:  ALCOHOL ABUSE (ICD-305.00) Pt denies.Marland Kitchen daughter insits this is a chronic issue for pt.  Counseled pt to decresae alcohol intake... eat three meals a day. offered info for AA.. pt doesn't think he needs.   Problem # 5:  DIABETES MELLITUS, TYPE II (ICD-250.00) Previously well controlled.Moody Bruins withglucose and A1c.  His updated medication list for this problem includes:    Cozaar 100 Mg Tabs (Losartan potassium) .Marland Kitchen... Take 1 blet by mouth once a day    Aspirin 325 Mg Tabs (Aspirin) ..... One tablet once daily  Orders: TLB-A1C / Hgb A1C (Glycohemoglobin) (83036-A1C)  Complete Medication List: 1)  Atenolol 50 Mg Tabs (Atenolol) .... Take 1 tablet by mouth two times a day 2)  Norvasc 5 Mg Tabs (Amlodipine besylate)  .... Take 1 tablet by mouth once a day 3)  Simvastatin 40 Mg Tabs (Simvastatin) .... Take 1 tablet by mouth once a day 4)  Isosorbide Dinitrate 10 Mg Tabs (Isosorbide dinitrate) .... Take 1 tablet by mouth three times a day 5)  Cvs Spectravite Senior Tabs (Multiple vitamins-minerals) .Marland Kitchen.. 1 x week 6)  Nexium 40 Mg Cpdr (Esomeprazole magnesium) .... Take 1 tablet by mouth once a day 7)  Cozaar 100 Mg Tabs (Losartan potassium) .... Take 1 tablet by mouth once a day 8)  Prodigy Autocode Blood Glucose Devi (Blood glucose monitoring suppl) .Marland Kitchen.. 1 device. check cbgs daily. dx 250.00 9)  Prodigy Blood Glucose Test Strp (Glucose blood) .... Check blood sugar daily. dx 250.00 10)  Aspirin 325 Mg Tabs (Aspirin) .... One tablet once daily   Orders Added: 1)  UA Dipstick W/ Micro (manual) [81000] 2)  T-2 View CXR [71020TC] 3)  TLB-BMP (Basic Metabolic Panel-BMET) [80048-METABOL] 4)  TLB-CBC Platelet - w/Differential [85025-CBCD] 5)  TLB-Hepatic/Liver Function Pnl [80076-HEPATIC] 6)  TLB-TSH (Thyroid Stimulating Hormone) [84443-TSH] 7)  TLB-B12 + Folate Pnl [[id number]-B12/FOL] 8)  TLB-A1C / Hgb A1C (Glycohemoglobin) [83036-A1C] 9)  Est. Patient Level IV [98119]    Prior Medications (reviewed today): ATENOLOL 50 MG  TABS (ATENOLOL) Take 1 tablet by mouth two times a day NORVASC 5 MG TABS (AMLODIPINE BESYLATE) Take 1 tablet by mouth once a day SIMVASTATIN 40 MG  TABS (SIMVASTATIN) Take 1 tablet by mouth once a day ISOSORBIDE DINITRATE 10 MG  TABS (ISOSORBIDE DINITRATE) Take 1 tablet by mouth three times a day [facility] SPECTRAVITE SENIOR   TABS (MULTIPLE VITAMINS-MINERALS) 1 x week NEXIUM 40 MG CPDR (ESOMEPRAZOLE MAGNESIUM) Take 1 tablet by mouth once a day COZAAR 100 MG TABS (LOSARTAN POTASSIUM) Take 1 tablet by mouth once a day PRODIGY AUTOCODE BLOOD GLUCOSE  DEVI (BLOOD GLUCOSE MONITORING SUPPL) 1 device. Check CBGs daily. Dx 250.00 PRODIGY BLOOD GLUCOSE TEST  STRP (GLUCOSE BLOOD) Check blood sugar  daily. Dx 250.00 ASPIRIN 325 MG TABS (ASPIRIN) one tablet once daily Current Allergies (reviewed today): ! * BLOOD THINNER ! METFORMIN HCL  Laboratory Results   Urine Tests  Date/Time Received: November 01, 2010 9:27 AM  Date/Time Reported: November 01, 2010 9:27 AM   Routine Urinalysis   Color: yellow Appearance: Clear Glucose: negative   (Normal Range: Negative) Bilirubin: negative   (Normal Range: Negative) Ketone: negative   (Normal Range: Negative) Spec. Gravity: 1.010   (Normal Range: 1.003-1.035) Blood: negative   (Normal Range: Negative) pH: 7.5   (Normal Range: 5.0-8.0) Protein: negative   (Normal Range: Negative) Urobilinogen: 0.2   (Normal Range: 0-1) Nitrite: negative   (  Normal Range: Negative) Leukocyte Esterace: negative   (Normal Range: Negative)

## 2010-11-07 ENCOUNTER — Other Ambulatory Visit: Payer: Self-pay | Admitting: Family Medicine

## 2010-11-07 ENCOUNTER — Encounter (INDEPENDENT_AMBULATORY_CARE_PROVIDER_SITE_OTHER): Payer: Self-pay | Admitting: *Deleted

## 2010-11-07 ENCOUNTER — Other Ambulatory Visit: Payer: Medicare Other

## 2010-11-07 DIAGNOSIS — R5381 Other malaise: Secondary | ICD-10-CM

## 2010-11-07 DIAGNOSIS — R5383 Other fatigue: Secondary | ICD-10-CM

## 2010-11-08 ENCOUNTER — Encounter: Payer: Self-pay | Admitting: Family Medicine

## 2010-11-08 ENCOUNTER — Ambulatory Visit (INDEPENDENT_AMBULATORY_CARE_PROVIDER_SITE_OTHER): Payer: Medicare Other | Admitting: Family Medicine

## 2010-11-08 DIAGNOSIS — R5381 Other malaise: Secondary | ICD-10-CM

## 2010-11-08 DIAGNOSIS — F068 Other specified mental disorders due to known physiological condition: Secondary | ICD-10-CM

## 2010-11-08 DIAGNOSIS — M545 Low back pain: Secondary | ICD-10-CM

## 2010-11-08 DIAGNOSIS — F1027 Alcohol dependence with alcohol-induced persisting dementia: Secondary | ICD-10-CM | POA: Insufficient documentation

## 2010-11-08 DIAGNOSIS — R05 Cough: Secondary | ICD-10-CM

## 2010-11-12 NOTE — Assessment & Plan Note (Signed)
Summary: 1 week follow up- spirometry   Vital Signs:  Patient profile:   77 year old male Height:      66 inches Weight:      177 pounds BMI:     28.67 Temp:     98.3 degrees F oral Pulse rate:   72 / minute Pulse rhythm:   regular BP sitting:   110 / 60  (left arm) Cuff size:   regular CC: 1 week follow up   History of Present Illness: 77 year old male is status post bypass surgery in 2002 at [facility].  Also has a history of diabetes, hypertension and hyperlipidemia.  Had a stress Myoview in 2010 which was negative.    At last VISIT:  Syncopal episode in 06/2010... went to [facility].  Had CT scan of head, EKG, labs etc... per pt's daughter everthing was nml.  Saw Dr. Ellis Parents in follow up... he though syncope was either hypoglycemia or ? arrythmia... Recommended event montior if further issues.  Since then no furthter syncope.  Since then low back pain, off and on... bilateral lumbar spine.  No numbness or tingling in legs.  Walking slower. No weakness in legs or arms Slowed though process since 06/2011.  Cough ongoing in last 1-2 months.  Productive. No ear pain, no fever, no face pain. No smoking... quit 6 years ago...< 25 pack year history but about 5 years. No SOB.   Has lost 10 lbs in last 4 months.  Since last OV.. nml CXR, neg hemeoccult stool, no anemia, nml thyroid, nml B12,HgA1C stable No UA.  Pt reports back pain is not bothering him much. Minimal cough per pt .Marland Kitchenno SOB.   Problems Prior to Update: 1)  Dementia  (ICD-294.8) 2)  Weakness  (ICD-780.79) 3)  Cough  (ICD-786.2) 4)  Low Back Pain Syndrome  (ICD-724.2) 5)  Syncope and Collapse  (ICD-780.2) 6)  Alcohol Abuse  (ICD-305.00) 7)  Renal Insufficiency, Chronic  (ICD-585.9) 8)  Obesity  (ICD-278.00) 9)  Hypertension  (ICD-401.9) 10)  Hyperlipidemia  (ICD-272.4) 11)  Gerd  (ICD-530.81) 12)  Diabetes Mellitus, Type II  (ICD-250.00) 13)  Coronary Artery Disease  (ICD-414.00) 14)  Allergic Rhinitis   (ICD-477.9)  Current Medications (verified): 1)  Atenolol 50 Mg  Tabs (Atenolol) .... Take 1 Tablet By Mouth Two Times A Day 2)  Norvasc 5 Mg Tabs (Amlodipine Besylate) .... Take 1 Tablet By Mouth Once A Day 3)  Simvastatin 40 Mg  Tabs (Simvastatin) .... Take 1 Tablet By Mouth Once A Day 4)  Isosorbide Dinitrate 10 Mg  Tabs (Isosorbide Dinitrate) .... Take 1 Tablet By Mouth Three Times A Day 5)  Cvs Spectravite Senior   Tabs (Multiple Vitamins-Minerals) .Marland Kitchen.. 1 X Week 6)  Nexium 40 Mg Cpdr (Esomeprazole Magnesium) .... Take 1 Tablet By Mouth Once A Day 7)  Cozaar 100 Mg Tabs (Losartan Potassium) .... Take 1 Tablet By Mouth Once A Day 8)  Prodigy Autocode Blood Glucose  Devi (Blood Glucose Monitoring Suppl) .Marland Kitchen.. 1 Device. Check Cbgs Daily. Dx 250.00 9)  Prodigy Blood Glucose Test  Strp (Glucose Blood) .... Check Blood Sugar Daily. Dx 250.00 10)  Aspirin 325 Mg Tabs (Aspirin) .... One Tablet Once Daily  Allergies: 1)  ! * Blood Thinner 2)  ! Metformin Hcl  Past History:  Past medical, surgical, family and social histories (including risk factors) reviewed, and no changes noted (except as noted below).  Past Medical History: Reviewed history from 04/27/2007 and no changes required.  Allergic rhinitis Coronary artery disease Diabetes mellitus, type II GERD Hyperlipidemia Hypertension  Past Surgical History: Reviewed history from 04/27/2007 and no changes required. 2002   Triple CABG 2003    Cataract Surgery             MRI:  tooth abscess  Family History: Reviewed history from 04/27/2007 and no changes required. Father: Died 82. HTN, CAD, high chol. Mother: Died 52, arthritis, HTN Siblings: 2 brothers CAD, 1 sister CAD - MI age 3, another sister DM - MI age 47  Social History: Reviewed history from 04/27/2007 and no changes required. Former Smoker, quit 6 years ago Alcohol use-yes, occasionally Regular exercise-yes, walks daily, 1 mile Marital Status: Married x 51  years Children: 53 years old, healthy Occupation: Retired Paediatric nurse, farming Diet:  (+) fruit and veggies, (+) H2O  Review of Systems General:  Denies fatigue and fever. CV:  Denies chest pain or discomfort. Resp:  Denies shortness of breath. GI:  Denies abdominal pain. GU:  Denies dysuria.  Physical Exam  General:  overweight male in NAD  Mouth:  poor dentition Neck:  no cervical or supraclavicular lymphadenopathy no carotid bruit or thyromegaly  Lungs:  Normal respiratory effort, chest expands symmetrically. Lungs are clear to auscultation, no crackles or wheezes. Heart:  Distant heart sounds. Normal rate and regular rhythm. S1 and S2 normal without gallop, murmur, click, rub or other extra sounds. Abdomen:  Bowel sounds positive,abdomen soft and non-tender without masses, organomegaly or hernias noted. Pulses:  R and L posterior tibial pulses are full and equal bilaterally  Extremities:  no edema Neurologic:  5,4,3,0,0,2,1,3,1, 1,0 Refused to spell world backwards.  Poor short term memory and some decline in executive functioning finished high school MMSE 20-23/30 Skin:  Intact without suspicious lesions or rashes Psych:  Oriented X3, normally interactive, good eye contact, not anxious appearing, and not depressed appearing.     Impression & Recommendations:  Problem # 1:  WEAKNESS (ICD-780.79) Lab eval nml. No evidence of GI bleed. Per pt weakness is not bothersome to him. Pt continues to deny alcohol abuse.  No new cardiac issues.Marland Kitchen last seen Dr. Elray Mcgregor 06/2010.. stable EKG.  Problem # 2:  COUGH (ICD-786.2) CXR nml.Marland KitchenMarland KitchenSpirometry today showed: only mild obstruction. Per pt cough better.   Problem # 3:  LOW BACK PAIN SYNDROME (ICD-724.2) Improved per pt. Treat with heat, strethcing. Call if pain not continuing to improve.  His updated medication list for this problem includes:    Aspirin 325 Mg Tabs (Aspirin) ..... One tablet once daily  Problem # 4:   DEMENTIA (ICD-294.8) MMSE 20-23/30 Likely secondary to age and ETOH chronic use.   Discussed issues in detail with pt's daughter Dois Davenport...she states pt continues to drik heavily despite refusing here. He is agressive and confontational with other family members and his wife. She does state that he is not physically abusive.  Complete Medication List: 1)  Atenolol 50 Mg Tabs (Atenolol) .... Take 1 tablet by mouth two times a day 2)  Norvasc 5 Mg Tabs (Amlodipine besylate) .... Take 1 tablet by mouth once a day 3)  Simvastatin 40 Mg Tabs (Simvastatin) .... Take 1 tablet by mouth once a day 4)  Isosorbide Dinitrate 10 Mg Tabs (Isosorbide dinitrate) .... Take 1 tablet by mouth three times a day 5)  Cvs Spectravite Senior Tabs (Multiple vitamins-minerals) .Marland Kitchen.. 1 x week 6)  Nexium 40 Mg Cpdr (Esomeprazole magnesium) .... Take 1 tablet by mouth once a day 7)  Cozaar  100 Mg Tabs (Losartan potassium) .... Take 1 tablet by mouth once a day 8)  Prodigy Autocode Blood Glucose Devi (Blood glucose monitoring suppl) .Marland Kitchen.. 1 device. check cbgs daily. dx 250.00 9)  Prodigy Blood Glucose Test Strp (Glucose blood) .... Check blood sugar daily. dx 250.00 10)  Aspirin 325 Mg Tabs (Aspirin) .... One tablet once daily  Patient Instructions: 1)  Cancel march appt and labs... move both back 3 months.    Orders Added: 1)  Est. Patient Level IV [16109]    Current Allergies (reviewed today): ! * BLOOD THINNER ! METFORMIN HCL

## 2010-11-17 ENCOUNTER — Emergency Department (HOSPITAL_COMMUNITY): Payer: Medicare Other

## 2010-11-17 ENCOUNTER — Emergency Department (HOSPITAL_COMMUNITY)
Admission: EM | Admit: 2010-11-17 | Discharge: 2010-11-17 | Disposition: A | Discharge status: Home / Self Care | Payer: Medicare Other | Attending: Emergency Medicine | Admitting: Emergency Medicine

## 2010-11-17 DIAGNOSIS — M545 Low back pain, unspecified: Secondary | ICD-10-CM | POA: Insufficient documentation

## 2010-11-17 DIAGNOSIS — I10 Essential (primary) hypertension: Secondary | ICD-10-CM | POA: Insufficient documentation

## 2010-11-17 DIAGNOSIS — S335XXA Sprain of ligaments of lumbar spine, initial encounter: Secondary | ICD-10-CM | POA: Insufficient documentation

## 2010-11-17 DIAGNOSIS — Z951 Presence of aortocoronary bypass graft: Secondary | ICD-10-CM | POA: Insufficient documentation

## 2010-11-17 DIAGNOSIS — R32 Unspecified urinary incontinence: Secondary | ICD-10-CM | POA: Insufficient documentation

## 2010-11-17 DIAGNOSIS — Z79899 Other long term (current) drug therapy: Secondary | ICD-10-CM | POA: Insufficient documentation

## 2010-11-17 DIAGNOSIS — E119 Type 2 diabetes mellitus without complications: Secondary | ICD-10-CM | POA: Insufficient documentation

## 2010-11-17 DIAGNOSIS — E7800 Pure hypercholesterolemia, unspecified: Secondary | ICD-10-CM | POA: Insufficient documentation

## 2010-11-17 DIAGNOSIS — X58XXXA Exposure to other specified factors, initial encounter: Secondary | ICD-10-CM | POA: Insufficient documentation

## 2010-11-17 DIAGNOSIS — I2510 Atherosclerotic heart disease of native coronary artery without angina pectoris: Secondary | ICD-10-CM | POA: Insufficient documentation

## 2010-11-17 DIAGNOSIS — Z7982 Long term (current) use of aspirin: Secondary | ICD-10-CM | POA: Insufficient documentation

## 2010-11-17 LAB — COMPREHENSIVE METABOLIC PANEL
ALT: 11 U/L (ref 0–53)
AST: 42 U/L — ABNORMAL HIGH (ref 0–37)
CO2: 26 mEq/L (ref 19–32)
Calcium: 8.8 mg/dL (ref 8.4–10.5)
Creatinine, Ser: 1.36 mg/dL (ref 0.4–1.5)
GFR calc Af Amer: >60 mL/min (ref >60)
GFR calc non Af Amer: 51 mL/min — ABNORMAL LOW (ref >60)
Sodium: 134 mEq/L — ABNORMAL LOW (ref 135–145)
Total Protein: 6.6 g/dL (ref 6.0–8.3)

## 2010-11-17 LAB — URINALYSIS, ROUTINE W REFLEX MICROSCOPIC
Glucose, UA: NEGATIVE mg/dL
Ketones, ur: 15 mg/dL — AB
Leukocytes, UA: NEGATIVE
Nitrite: NEGATIVE
Specific Gravity, Urine: 1.027 (ref 1.005–1.030)
pH: 6 (ref 5.0–8.0)

## 2010-11-17 LAB — DIFFERENTIAL
Basophils Absolute: 0 10*3/uL (ref 0.0–0.1)
Lymphs Abs: 1.6 10*3/uL (ref 0.7–4.0)
Monocytes Absolute: 0.9 10*3/uL (ref 0.1–1.0)
Monocytes Relative: 9 % (ref 3–12)
Neutro Abs: 7.4 10*3/uL (ref 1.7–7.7)

## 2010-11-17 LAB — GLUCOSE, CAPILLARY: Glucose-Capillary: 161 mg/dL — ABNORMAL HIGH (ref 70–99)

## 2010-11-17 LAB — URINE MICROSCOPIC-ADD ON

## 2010-11-17 LAB — CBC
Hemoglobin: 11.8 g/dL — ABNORMAL LOW (ref 13.0–17.0)
MCH: 31 pg (ref 26.0–34.0)
MCHC: 35.5 g/dL (ref 30.0–36.0)

## 2010-11-23 ENCOUNTER — Encounter: Payer: Self-pay | Admitting: Family Medicine

## 2010-12-09 ENCOUNTER — Other Ambulatory Visit (INDEPENDENT_AMBULATORY_CARE_PROVIDER_SITE_OTHER): Payer: Medicare Other | Admitting: Family Medicine

## 2010-12-09 DIAGNOSIS — E785 Hyperlipidemia, unspecified: Secondary | ICD-10-CM

## 2010-12-09 DIAGNOSIS — E119 Type 2 diabetes mellitus without complications: Secondary | ICD-10-CM

## 2010-12-09 LAB — LIPID PANEL
HDL: 84.9 mg/dL (ref >39.00)
Total CHOL/HDL Ratio: 2
VLDL: 21.4 mg/dL (ref 0.0–40.0)

## 2010-12-09 LAB — MICROALBUMIN / CREATININE URINE RATIO
Creatinine,U: 148 mg/dL
Microalb, Ur: 1.3 mg/dL (ref 0.0–1.9)

## 2010-12-09 LAB — COMPREHENSIVE METABOLIC PANEL
ALT: 10 U/L (ref 0–53)
AST: 18 U/L (ref 0–37)
Creatinine, Ser: 1.1 mg/dL (ref 0.4–1.5)
Sodium: 137 mEq/L (ref 135–145)
Total Bilirubin: 1.1 mg/dL (ref 0.3–1.2)
Total Protein: 7.1 g/dL (ref 6.0–8.3)

## 2010-12-09 LAB — HEMOGLOBIN A1C: Hgb A1c MFr Bld: 6 % (ref 4.6–6.5)

## 2010-12-13 ENCOUNTER — Encounter: Payer: Self-pay | Admitting: Family Medicine

## 2010-12-27 ENCOUNTER — Telehealth: Payer: Self-pay | Admitting: *Deleted

## 2010-12-27 NOTE — Telephone Encounter (Signed)
Go ahead and work him in where you can... 30 min if available.

## 2010-12-27 NOTE — Telephone Encounter (Signed)
Pt's daughter called to report that pt's dementia has gotten worse and he is drinking a lot.  Daughter doesn't know what to do.  She wants him to come back in for appt, but he wont come in on his own, she wants [place] to call him and tell him he has appt.  She said he will show up if we set something up for him.

## 2010-12-27 NOTE — Telephone Encounter (Signed)
Please arrange a f/u appt with Dr. Ermalene Searing.  You may have to discuss with her next week about working him in

## 2010-12-30 NOTE — Telephone Encounter (Signed)
Patient advised and appt made

## 2011-01-07 ENCOUNTER — Ambulatory Visit (INDEPENDENT_AMBULATORY_CARE_PROVIDER_SITE_OTHER): Payer: Medicare Other | Admitting: Family Medicine

## 2011-01-07 ENCOUNTER — Encounter: Payer: Self-pay | Admitting: Family Medicine

## 2011-01-07 DIAGNOSIS — F068 Other specified mental disorders due to known physiological condition: Secondary | ICD-10-CM

## 2011-01-07 DIAGNOSIS — M545 Low back pain: Secondary | ICD-10-CM

## 2011-01-07 DIAGNOSIS — F1010 Alcohol abuse, uncomplicated: Secondary | ICD-10-CM

## 2011-01-07 NOTE — Progress Notes (Signed)
Subjective:    Patient ID: Tom Sanders, male    DOB: August 18, 1930, 77 y.o.   MRN: [id number]  HPI 77 year old alcoholic with worsening dementia presents with his daughter given progressive memory loss and anger issues.  Status post bypass surgery in 2002 at [facility]. Also has a history of diabetes, hypertension and hyperlipidemia. Had a stress Myoview in 2010 which was negative.  Syncopal episode in 06/2010... went to [facility].  Had CT scan of head, EKG, labs etc...  everthing was nml.  Saw Dr. Derl Barrow in follow up... he though syncope was either hypoglycemia or ? arrythmia  In 10/2010 Nml CXR, neg hemeoccult stool, no anemia, nml thyroid, nml B12,HgA1C stable  Nml UA.  Seen in ER on 3/9 for weakness, back pain , ? Slurred speech, that day seemed to be having issues moving right side of body,? Due to pain. Dx with lumbar strain.. Given percocet, plain films of spine showed DDD L 4-l5 Glucose 161, cbc showed Hg 11.8, nml UA, AST high, mildly low Na of 134.  Daughter has noted worsening gradually in last few weeks... Losing things more. Issues with short term memory. No further syncopal spells. No focal neuro changes. No numbness. No further slurred speech (difficulty finding words),  (no visual changes). Back pain continues intermittantly.. Did not fill Percocet due to concern of use with alcohol.  Pt sticks by report that he only has three ounces a day of ETOH.. States he understands need to decrease/stop ...but has not shown any inclination for cessation. AA offered but not really appropriate in this case.        Review of Systems  Constitutional: Negative for fever, fatigue and unexpected weight change.  HENT: Negative for ear pain, sore throat and trouble swallowing.   Eyes: Negative for pain.  Respiratory: Negative for cough, shortness of breath and wheezing.   Cardiovascular: Negative for chest pain, palpitations and leg swelling.  Gastrointestinal: Negative for nausea,  abdominal pain, diarrhea, constipation and blood in stool.  Genitourinary: Negative for dysuria, urgency, hematuria and difficulty urinating.  Skin: Negative for rash.  Neurological: Negative for syncope, weakness, light-headedness, numbness and headaches.  Psychiatric/Behavioral: Negative for behavioral problems and dysphoric mood. The patient is not nervous/anxious.        Objective:   Physical Exam  Constitutional: Vital signs are normal.       Obese appearing elderly male in NAD  HENT:  Head: Normocephalic. Head is without contusion.  Right Ear: Hearing, tympanic membrane, external ear and ear canal normal.  Left Ear: Hearing, tympanic membrane, external ear and ear canal normal.  Nose: Nose normal.  Mouth/Throat: Oropharynx is clear and moist and mucous membranes are normal. He does not have dentures. Abnormal dentition. Dental caries present. No uvula swelling.  Eyes: Conjunctivae, EOM and lids are normal. Pupils are equal, round, and reactive to light. No foreign bodies found.  Fundoscopic exam:      The right eye shows no arteriolar narrowing, no exudate and no papilledema.       The left eye shows no arteriolar narrowing, no exudate and no papilledema.  Neck: Trachea normal, normal range of motion and full passive range of motion without pain. Neck supple. Carotid bruit is not present. No mass and no thyromegaly present.  Cardiovascular: Normal rate, regular rhythm, normal heart sounds and normal pulses.   Pulmonary/Chest: Effort normal and breath sounds normal. No respiratory distress. He has no decreased breath sounds. He has no wheezes. He has no  rhonchi. He has no rales.  Abdominal: Soft. Normal appearance. There is no tenderness.  Neurological: He is alert. He has normal strength and normal reflexes. He is not disoriented. He displays no atrophy. No cranial nerve deficit or sensory deficit. He exhibits normal muscle tone. Coordination and gait abnormal.       slowed gait,  could not perform hand flip test  MMSE scanned in : score:18/30  Psychiatric: His mood appears not anxious. His speech is delayed. He is slowed. He is not agitated, not aggressive, not withdrawn and not combative. He expresses inappropriate judgment. He does not exhibit a depressed mood. He expresses no suicidal ideation. He exhibits abnormal recent memory. He exhibits normal remote memory.          Assessment & Plan:

## 2011-01-07 NOTE — Patient Instructions (Signed)
Stop by front desk to set up referrals.  Use tylenol for back pain and heat on low back.

## 2011-01-07 NOTE — Assessment & Plan Note (Addendum)
Consistent with degenerative disc disease, possible mild radiculopathy symptoms. NSAIDs and narcotics not recommended in this alcoholic. Use tylenol for pain...will begin by referral to PT.Marland Kitchen Unsure if he will keep this appt. Refer to back specialist for consideration of epidural injections if not improving.

## 2011-01-07 NOTE — Assessment & Plan Note (Addendum)
Worsening gradually but also some unusual acute symptoms of ? Slurred speech, right sided weakness, aphasia.  Also abnormal cerebellar exam. MMSE worsened from last check. Given negative work up so far (no sign of infection etc.) dementia is most likely related to chronic heavy alcohol use. Counseled pt to stop. Will eval new neuro symptoms with MRI brain to rule out stroke. Pt on aspirin daily. 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

## 2011-01-14 ENCOUNTER — Encounter: Payer: Medicare Other | Admitting: Family Medicine

## 2011-01-15 ENCOUNTER — Ambulatory Visit
Admission: RE | Admit: 2011-01-15 | Discharge: 2011-01-15 | Disposition: A | Discharge status: Home / Self Care | Payer: Medicare Other | Source: Ambulatory Visit | Attending: Family Medicine | Admitting: Family Medicine

## 2011-01-15 DIAGNOSIS — F068 Other specified mental disorders due to known physiological condition: Secondary | ICD-10-CM

## 2011-01-16 ENCOUNTER — Telehealth: Payer: Self-pay | Admitting: *Deleted

## 2011-01-16 NOTE — Telephone Encounter (Signed)
Crystal called to let you know that patient had an appt for PT this morning, but he no showed. Crystal called him to see if he was okay and if he wanted to reschedule and she spoke with patient's wife and per wife patient does not want to have Physical therapy.

## 2011-01-20 ENCOUNTER — Ambulatory Visit: Payer: Medicare Other | Admitting: Family Medicine

## 2011-01-21 NOTE — Telephone Encounter (Signed)
Aware. 

## 2011-01-28 NOTE — Assessment & Plan Note (Signed)
[facility] OFFICE NOTE   NAME:Helmes, BRADIE LACOCK                   MRN:          [id number]  DATE:02/16/2008                            DOB:          1930/05/17    REASON FOR CONSULTATION:  Coronary artery disease, establish a new  cardiologist.   Mr. Muscarella is a delightful 77 year old male with a history of  coronary artery disease.  He is status post bypass surgery in 2002 at  [facility].  Also has a history of diabetes, hypertension and hyperlipidemia.   He tells me that he has not had any problems with his heart since bypass  surgery in 2002.  In 08-Oct-2022 his brother died and he noticed some jaw  pain and went to [facility].  Apparently had a stress Myoview which was  negative and eventually found to have a tooth abscess.  This was taken  care of.   His diabetes has been followed quite closely by Dr. Ermalene Searing.  He is  fairly active.  He has not had any chest pain or shortness of breath.  No heart failure symptoms.   REVIEW OF SYSTEMS:  Notable for gastroesophageal reflux, some arm and  shoulder arthritis, constipation and allergies.  The remainder of the  review of systems is negative except for HPI and problem list.  For  detailed list, please see the intake form and the clinic note, which I  have reviewed.   PAST MEDICAL HISTORY:  1. Coronary artery disease.      a.     Status post bypass surgery at [facility] in 2002.      b.     Negative Myoview in January 24, 2009for jaw pain.  2. Hypertension.  3. Hyperlipidemia.  4. Diabetes.  5. Obesity.  6. Arthritis.  7. Gastroesophageal reflux disease.   CURRENT MEDICATIONS:  1. Amlodipine 5 mg a day.  2. Simvastatin 40 mg a day.  3. Glipizide 5 mg b.i.d.  4. Aspirin 81 mg a day.  5. Prevacid 30 mg a day.  6. Cozaar 100 mg a day.  7. Hydrochlorothiazide 25 mg a day.  8. Isosorbide dinitrate 10 mg t.i.d.  9. Atenolol 50 mg b.i.d.  10.Multivitamin.   ALLERGIES:  No known drug allergies.   SOCIAL HISTORY:  He is married.  He has 10 children.  He is retired.  He  used to work on a [employer].  He denies any current tobacco use.  He  did smoke a few cigarettes a day back in 1969.  No alcohol.   FAMILY HISTORY:  Two sisters died from heart disease in the past.  Mother died at 49 from heart trouble.  Father died at 20, he did have a  history of coronary artery disease.  He has a sister who is 60, who is  alive.   PHYSICAL EXAM:  He is well-appearing, in no acute distress.  Ambulates  around the clinic without any respiratory difficulty.  Blood pressure was 130/66, heart rate 70, weight 205.  HEENT:  Normal.  NECK:  Supple.  No JVD.  Carotids are  2+ bilaterally without any bruits.  There is no lymphadenopathy or thyromegaly.  CARDIAC:  PMI is nondisplaced.  Regular rate and rhythm.  No murmurs.  He has a soft S4.  LUNGS:  Clear.  ABDOMEN:  Obese, nontender.  No obvious hepatosplenomegaly.  No bruits,  no masses.  Good bowel sounds.  EXTREMITIES:  Warm with no cyanosis, clubbing or edema.  No rash.  NEUROLOGIC:  Alert and oriented x3.  Cranial nerves II-XII intact.  He  moves all four extremities without difficulty.  Affect is pleasant.   EKG shows sinus rhythm at a rate of 70.  There is a first-degree AV  block with a PR interval of 202 msec.   ASSESSMENT/PLAN:  1. Coronary artery disease is quite stable.  Continue current therapy.      We will attempt to get the results of his Myoview from [facility].  Can      consider stopping his isosorbide or switching to Imdur as needed to      simplify his medication regimen.  2. Hypertension.  Blood pressure is just a little bit on the upper end      of normal.  Consider increasing the amlodipine as necessary.  I      will leave this to Dr. Ermalene Searing.  3. Hyperlipidemia.  Followed by Dr. Ermalene Searing.  Goal LDL is less than      70.  Titrate statin as needed.  4. Obesity.  I recommended that he  continue a regular exercise      program.   DISPOSITION:  We will see him back on a 51-month basis for further follow-  up.     Bevelyn Buckles. Bensimhon, MD  Electronically Signed   DRB/MedQ  DD: 02/16/2008  DT: 02/16/2008  Job #: [id number]   cc:   [name], MD

## 2011-01-31 NOTE — Assessment & Plan Note (Signed)
[facility] OFFICE NOTE   NAME:Tom Sanders, Bahl                   MRN:          [id number]  DATE:09/02/2006                            DOB:          Dec 13, 1929    CHIEF COMPLAINT:  A 77 year old male here to establish new doctor.   HISTORY OF PRESENT ILLNESS:  [name] is changing his primary care  practice to be closer to home.  He was previously seen at [facility] following  a hospitalization there, but it is getting harder for him to drive that  far.  He has the following concerns:  1. Gastroesophageal reflux disease:  He states that this was well      controlled previously in the past on Nexium but because he should      only be on Nexium for a limited time, he was instructed to try      Prilosec.  When he went to the pharmacy he got a prescription for      omeprazole.  He is taking a total of 40 mg daily but states that      this is not helping.  He then went and bought Prilosec over-the-      counter and states that that worked significantly better for him.      He would like a prescription so that he can get Prilosec if      possible in the non-generic Prilosec form.  2. Hypertension, stable:  He states that his blood pressure has been      fairly well controlled on multiple blood pressure medications      including Cozaar, Atenolol, amlodipine, hydrochlorothiazide.  He      denies chest pain or shortness of breath.  3. Diabetes, stable:  He states his blood sugars range fasting in the      morning between 120-135.  He thinks his last hemoglobin A1c was      checked in October 2007 and was approximately 7.  He takes      Glipizide 5 mg twice daily for blood sugar control.  He does eat a      healthy diet as well as possible.  4. High cholesterol:  He states that this has been very well      controlled on Simvastatin 40 mg daily.  He denies any myalgia or      arthralgia.  He states that this again was  likely tested at October      2007 at [facility] and he states that they told him that everything was      good.   REVIEW OF SYSTEMS:  No headache, no dizziness, no syncope, wears  glasses.  Some mild to moderate hearing loss but he is not interested in  hearing aid.  No chest pain, no shortness of breath, no palpitations, no  nausea and vomiting, diarrhea, constipation or rectal bleeding.   PAST MEDICAL HISTORY:  1. Diabetes.  2. Hypercholesterolemia.  3. Hypertension.  4. Coronary artery disease.  5. GERD.  6. Allergic rhinitis.   HOSPITALIZATIONS SURGERIES PROCEDURES:  1. 2002 triple CABG.  2. 2003 cataract surgery.  3. Colonoscopy in 2003 within normal limits.   ALLERGIES:  BLOOD THINNER, unspecified reaction.   CURRENT MEDICATIONS:  1. Cozaar 50 mg daily.  2. Omeprazole 20 mg daily.  3. Atenolol 50 mg p.o. b.i.d.  4. Amlodipine 5 mg p.o. daily.  5. Hydrochlorothiazide 25 mg daily.  6. Glipizide 5 mg b.i.d.  7. Simvastatin 40 mg daily.  8. Isosorbide 10 mg t.i.d.  9. Aspirin 81 mg daily.  10.Spectravite Senior 1 tablet per week.   FAMILY HISTORY:  His father died at age 72 with hypertension, coronary  artery disease, and high cholesterol.  His mother died at age 75 with  arthritis and high blood pressure.  He has 2 brothers who both have  coronary artery disease.  He has 3 sisters one of whom had a heart  attack at age 38 and a second one who had a heart attack at age 67.  One  of them had diabetes.  There is no family history of any type of cancer.   SOCIAL HISTORY:  He has a history of being a smoker but quit 6 years  ago.  He states he uses alcohol, including 3-4 ounces of bourbon per  week, spread out through the week and not all at once.  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  He has  been married for 51 years.  He has 10 children who are healthy.  He  walks daily about 1 mile around [place].  He eats fruits, vegetables and   lots of water.   PHYSICAL EXAMINATION:  VITALS:  Height 5 feet 6 inches, weight 217  making BMI 34.  Blood pressure 110/69, pulse 69, temperature 97.5.  GENERAL:  Elderly appearing, obese male in no apparent distress.  HEENT:  Arcus senilius, PERRLA, extraocular muscles intact, oropharynx  clear, tympanic membranes clear, wears dentures.  No thyromegaly, no  lymphadenopathies, supraclavicular or cervical.  CARDIOVASCULAR:  Regular rate and rhythm, no murmurs, rubs or gallops,  normal PMI, 2+ peripheral pulses, no peripheral edema.  PULMONARY:  Clear to auscultation bilaterally, no wheezes, rales or  rhonchi.  ABDOMEN:  Soft, obese, nontender, normoactive bowel sounds, no  hepatosplenomegaly.  MUSCULOSKELETAL:  Strength 5/5 in upper and lower extremities.  NEURO:  Monofilament within normal limits bilaterally.  No lesions on  feet.  Alert and oriented x3, Cranial nerves II-XII grossly intact.  Reflexes: Patellar 1+ bilaterally.   ASSESSMENT AND PLAN:  1. Gastroesophageal reflux disease, poorly controlled:  He was given a      prescription for Prilosec to try and fill.  He will let me know if      he is unable to get this.  I would think that this medication works      similarly to omeprazole given that it is the same compound, but he      states that Prilosec works much better for him.  If the Prilosec      does not seem to be helping, we can always return to Nexium.  2. Hypertension, well controlled:  The patient will continue to take      Cozaar, atenolol, amlodipine and hydrochlorothiazide as well as      isosorbide t.i.d. for angina.  He does not have a cardiologist in      the area.  I will obtain records from his cardiologist at [facility].  We      can consider referring  him for current followup if needed.  3. Diabetes, well controlled:  I will obtain records to determine when     last micro albumin, creatinine, and hemoglobin A1c were done.  He      will continue on Glipizide at this  point in time.  The patient was      instructed that we will follow him at every 3 months' appointments      regularly after I obtain the records.  4. High cholesterol.  He will continue Simvastatin daily.  I will      obtain records to determine when last cholesterol was done.  5. Prevention:  He is up to date with vaccines including tetanus,      Pneumovax and influenza.  He is up to date with colon cancer      screening.  I am not sure when he had a PSA done in the past but      given his age we could consider holding off on doing this.  He was      encouraged to cut back on alcohol given that it will make his      diabetes worse.  I did encourage him to keep up the good amount of      exercise and his healthy diet.     [name], MD  Electronically Signed    AB/MedQ  DD: 09/04/2006  DT: 09/04/2006  Job #: [id number]

## 2011-02-11 ENCOUNTER — Encounter: Payer: Self-pay | Admitting: Family Medicine

## 2011-02-11 ENCOUNTER — Ambulatory Visit (INDEPENDENT_AMBULATORY_CARE_PROVIDER_SITE_OTHER): Payer: Medicare Other | Admitting: Family Medicine

## 2011-02-11 ENCOUNTER — Ambulatory Visit: Payer: Medicare Other | Admitting: Family Medicine

## 2011-02-11 DIAGNOSIS — Z0000 Encounter for general adult medical examination without abnormal findings: Secondary | ICD-10-CM

## 2011-02-11 DIAGNOSIS — I2510 Atherosclerotic heart disease of native coronary artery without angina pectoris: Secondary | ICD-10-CM

## 2011-02-11 DIAGNOSIS — F1010 Alcohol abuse, uncomplicated: Secondary | ICD-10-CM

## 2011-02-11 DIAGNOSIS — F068 Other specified mental disorders due to known physiological condition: Secondary | ICD-10-CM

## 2011-02-11 DIAGNOSIS — E785 Hyperlipidemia, unspecified: Secondary | ICD-10-CM

## 2011-02-11 DIAGNOSIS — E119 Type 2 diabetes mellitus without complications: Secondary | ICD-10-CM

## 2011-02-11 DIAGNOSIS — I10 Essential (primary) hypertension: Secondary | ICD-10-CM

## 2011-02-11 NOTE — Patient Instructions (Addendum)
Call insurance about about shingles vaccine coverage. Discuss with family about health care power of attorney and living will. TAKE MEDICATION AS INSTRUCTED. Work on slowly decreasing alcohol, do not stop all suddenly.  Call if interested in medication for memory.

## 2011-02-11 NOTE — Progress Notes (Signed)
Subjective:    Patient ID: Tom Sanders, male    DOB: 1929/12/20, 77 y.o.   MRN: [id number]  HPI I have personally reviewed the Medicare Annual Wellness questionnaire and have noted 1. The patient's medical and social history 2. Their use of alcohol, tobacco or illicit drugs 3. Their current medications and supplements 4. The patient's functional ability including ADL's, fall risks, home safety risks and hearing or visual             impairment. 5. Diet and physical activities 6. Evidence for depression or mood disorders The patients weight, height, BMI and visual acuity have been recorded in the chart I have made referrals, counseling and provided education to the patient based review of the above and I have provided the pt with a written personalized care plan for preventive services.   Reviewed last OV for full summary of recent events and mental status and neuro eval.  LOW BACK PAIN SYNDROME - [name], MD 01/10/2011 8:34 AM  Consistent with degenerative disc disease, possible mild radiculopathy symptoms.  NSAIDs and narcotics not recommended in this alcoholic.  Use tylenol for pain...will begin by referral to PT.Marland Kitchen  Refer to back specialist for consideration of epidural injections if not improving  Since last OV pt canceled PT and is not interested in spinal injections. .   DEMENTIA - [name], MD 01/07/2011 11:02 AM  Worsening gradually but also some unusual acute symptoms of ? Slurred speech, right sided weakness, aphasia.  Also abnormal cerebellar exam.  MMSE worsened from last check.  Given negative work up so far (no sign of infection etc.) dementia is most likely related to chronic heavy alcohol use. Counseled pt to stop.  Will eval new neuro symptoms with MRI brain to rule out stroke. Pt on aspirin daily.  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  MRI brain returned with  diffuse atrophy and microvascular changes, no specific stroke.   No recent falls or syncopal events.     Review of Systems  Constitutional: Negative for fever, fatigue and unexpected weight change.  HENT: Negative for ear pain, congestion, sore throat, rhinorrhea, trouble swallowing and postnasal drip.   Eyes: Negative for pain.  Respiratory: Negative for cough, shortness of breath and wheezing.   Cardiovascular: Negative for chest pain, palpitations and leg swelling.  Gastrointestinal: Negative for nausea, abdominal pain, diarrhea, constipation and blood in stool.  Genitourinary: Positive for urgency. Negative for dysuria, hematuria, discharge, penile swelling, scrotal swelling, difficulty urinating, penile pain and testicular pain.  Skin: Negative for rash.  Neurological: Negative for syncope, weakness, light-headedness, numbness and headaches.  Psychiatric/Behavioral: Negative for behavioral problems and dysphoric mood. The patient is not nervous/anxious.        Objective:   Physical Exam  Constitutional: He is oriented to person, place, and time. He appears well-developed and well-nourished.  Non-toxic appearance. He does not appear ill. No distress.       Unwashed, zipper undone  HENT:  Head: Normocephalic and atraumatic.  Right Ear: Hearing, tympanic membrane, external ear and ear canal normal.  Left Ear: Hearing, tympanic membrane, external ear and ear canal normal.  Nose: Nose normal.  Mouth/Throat: Uvula is midline, oropharynx is clear and moist and mucous membranes are normal.  Eyes: Conjunctivae, EOM and lids are normal. Pupils are equal, round, and reactive to light. No foreign bodies found.  Neck: Trachea normal, normal range of motion  and phonation normal. Neck supple. Carotid bruit is not present. No mass and no thyromegaly present.  Cardiovascular: Normal rate, regular rhythm, S1 normal, S2 normal, intact distal pulses and normal pulses.  Exam reveals no gallop.   No  murmur heard. Pulmonary/Chest: Breath sounds normal. He has no wheezes. He has no rhonchi. He has no rales.  Abdominal: Soft. Normal appearance and bowel sounds are normal. There is no hepatosplenomegaly. There is no tenderness. There is no rebound, no guarding and no CVA tenderness. No hernia.  Genitourinary: Prostate normal.  Lymphadenopathy:    He has no cervical adenopathy.  Neurological: He is alert and oriented to person, place, and time. He has normal strength and normal reflexes. No cranial nerve deficit or sensory deficit. Gait normal.       MMSE 18/30  Skin: Skin is warm, dry and intact. No rash noted.  Psychiatric: He has a normal mood and affect. His speech is normal and behavior is normal.          Assessment & Plan:  Annual Medicare Wellness: The patient's preventative maintenance and recommended screening tests for an annual wellness exam were reviewed in full today. Brought up to date unless services declined.  Counselled on the importance of diet, exercise, and its role in overall health and mortality. The patient's FH and SH was reviewed, including their home life, tobacco status, and drug and alcohol status.

## 2011-02-20 ENCOUNTER — Other Ambulatory Visit: Payer: Self-pay | Admitting: Family Medicine

## 2011-02-27 ENCOUNTER — Encounter: Payer: Self-pay | Admitting: Internal Medicine

## 2011-03-05 ENCOUNTER — Encounter: Payer: Self-pay | Admitting: Internal Medicine

## 2011-03-05 ENCOUNTER — Ambulatory Visit (INDEPENDENT_AMBULATORY_CARE_PROVIDER_SITE_OTHER): Payer: Medicare Other | Admitting: Internal Medicine

## 2011-03-05 VITALS — BP 109/64 | HR 68 | Ht 66.0 in | Wt 175.0 lb

## 2011-03-05 DIAGNOSIS — I10 Essential (primary) hypertension: Secondary | ICD-10-CM

## 2011-03-05 DIAGNOSIS — I2510 Atherosclerotic heart disease of native coronary artery without angina pectoris: Secondary | ICD-10-CM

## 2011-03-05 DIAGNOSIS — E785 Hyperlipidemia, unspecified: Secondary | ICD-10-CM

## 2011-03-05 NOTE — Assessment & Plan Note (Signed)
No evidence of ischemia. Continue current regimen. Encouraged him to continue his walking program.

## 2011-03-05 NOTE — Progress Notes (Signed)
HPI:  Tom Sanders is a delightful 77 year old male with a history of coronary artery disease.  He is status post bypass surgery in 2002 at [facility].  Also has a history of diabetes, hypertension and hyperlipidemia. Had a stress Myoview in 2010 which was negative.    Returns today fro routine f/u. Doing well. Walking every day about 1/2-1 mile. No CP or dyspnea. BP well controlled. Taking all medications without problems. Has cut way back on ETOH use.    ROS: All systems negative except as listed in HPI, PMH and Problem List.  Past Medical History  Diagnosis Date  . Allergy   . Coronary artery disease   . GERD (gastroesophageal reflux disease)   . HLD (hyperlipidemia)   . Hypertension   . Allergic rhinitis   . Diabetes mellitus type II     Current Outpatient Prescriptions  Medication Sig Dispense Refill  . amLODipine (NORVASC) 5 MG tablet take 1 tablet by mouth once daily  30 tablet  11  . aspirin 325 MG tablet Take 325 mg by mouth daily.        Marland Kitchen atenolol (TENORMIN) 50 MG tablet Take 50 mg by mouth 2 (two) times daily.        . Blood Glucose Monitoring Suppl (PRODIGY AUTOCODE BLOOD GLUCOSE) W/DEVICE KIT Check blood sugar daily       . esomeprazole (NEXIUM) 40 MG packet Take 40 mg by mouth daily.        Marland Kitchen glucose blood test strip 1 each daily. Use as instructed       . isosorbide dinitrate (ISORDIL) 10 MG tablet Take 10 mg by mouth 3 (three) times daily.        Marland Kitchen losartan (COZAAR) 100 MG tablet Take 100 mg by mouth daily.        . Multiple Vitamin (MULTIVITAMIN) capsule Take one tablet by mouth once a week       . Multiple Vitamins-Minerals ([employer] SPECTRAVITE SENIOR PO) Take 1 tablet by mouth once a week.        . naproxen sodium (ANAPROX) 220 MG tablet Take 220 mg by mouth 2 (two) times daily with a meal.        . simvastatin (ZOCOR) 40 MG tablet Take 40 mg by mouth daily.           PHYSICAL EXAM: Filed Vitals:   03/05/11 1543  BP: 109/64  Pulse: 68   General:  Well appearing.  No resp difficulty HEENT: normal Neck: supple. JVP flat. Carotids 2+ bilaterally; no bruits. No lymphadenopathy or thryomegaly appreciated. Cor: PMI normal. Regular rate & rhythm. No rubs, gallops or murmurs. Lungs: clear Abdomen: soft, nontender, nondistended. No hepatosplenomegaly. No bruits or masses. Good bowel sounds. Extremities: no cyanosis, clubbing, rash, edema Neuro: alert & orientedx3, cranial nerves grossly intact. Moves all 4 extremities w/o difficulty. Affect pleasant.    ECG: NSR 68. Anteroseptal Qs No ST-T wave abnormalities.  (no change 09/27/2009)    ASSESSMENT & PLAN:

## 2011-03-05 NOTE — Assessment & Plan Note (Signed)
Blood pressure well controlled. Continue current regimen.  

## 2011-03-05 NOTE — Assessment & Plan Note (Signed)
Followed by Dr. Ermalene Searing. Goal LDL < 70. Continue statin.

## 2011-03-25 IMAGING — CT CT HEAD WITHOUT CONTRAST
2 series · 16 of 30 positions shown, 20 images · non-contrast
Comparison: none

REASON FOR EXAM: near syncope  fall
COMMENTS:   May transport without cardiac monitor

PROCEDURE:     CT  - CT HEAD WITHOUT CONTRAST  - July 02, 2010  [DATE]
RESULT:     Head CT dated 07/02/2010
TECHNIQUE: Helical noncontrasted 5 mm sections were obtained from the skull
base through the vertex.

[Series 2: without · axial · non-contrast · 0.46mm/px · z∈[+936,+1062]mm · 13 of 31 slices shown, 17 images]
[im 3/31  brain]
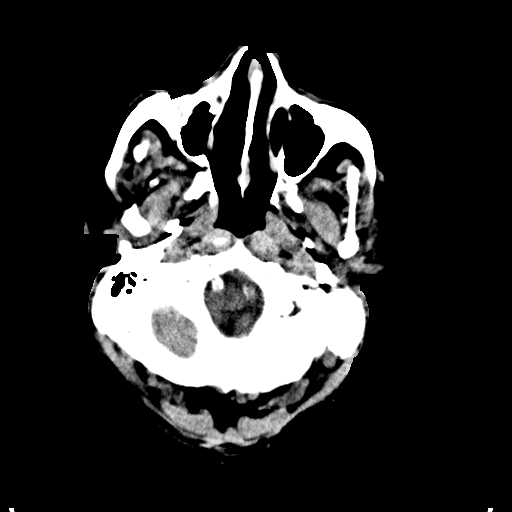
[im 3/31  bone]
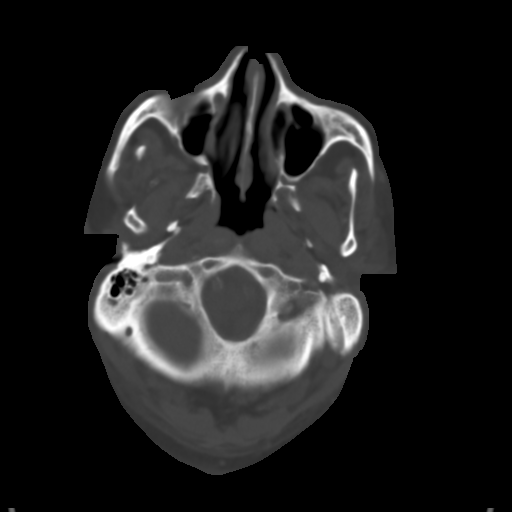
[im 5/31  brain]
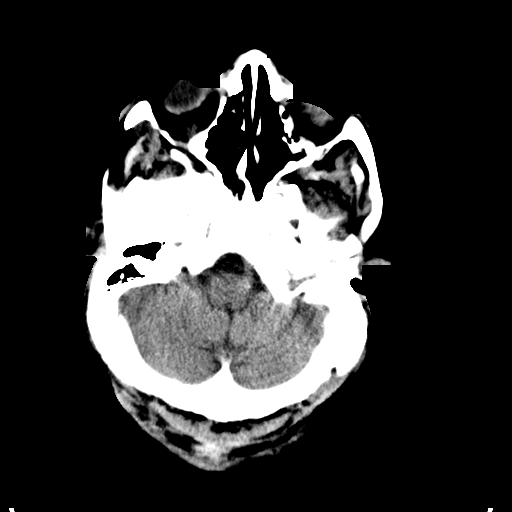
[im 7/31  brain]
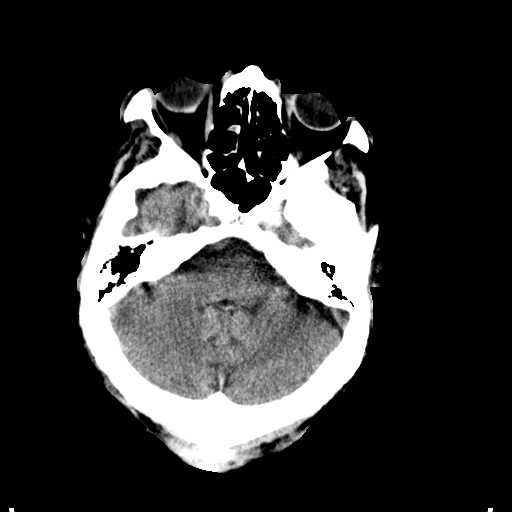
[im 9/31  brain]
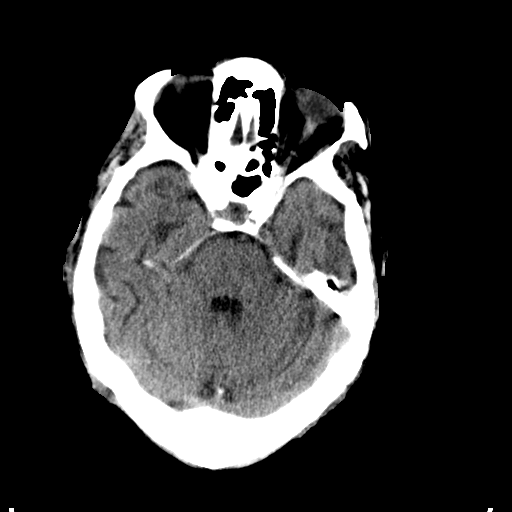
[im 11/31  brain]
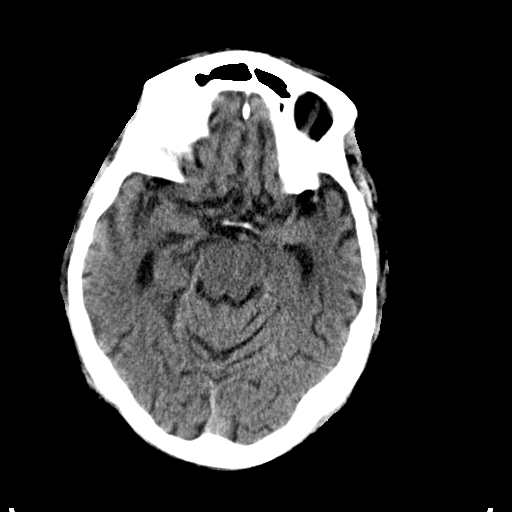
[im 11/31  bone]
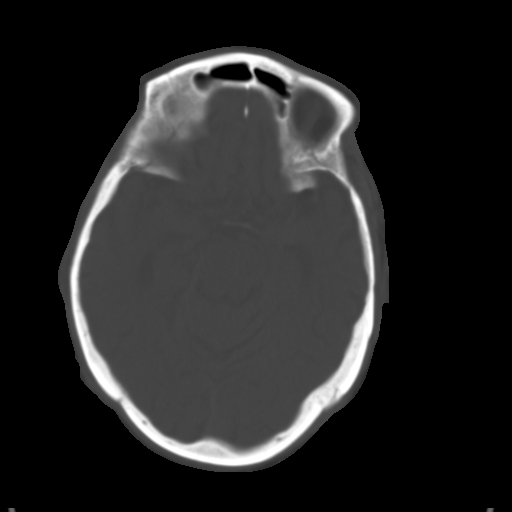
[im 13/31  brain]
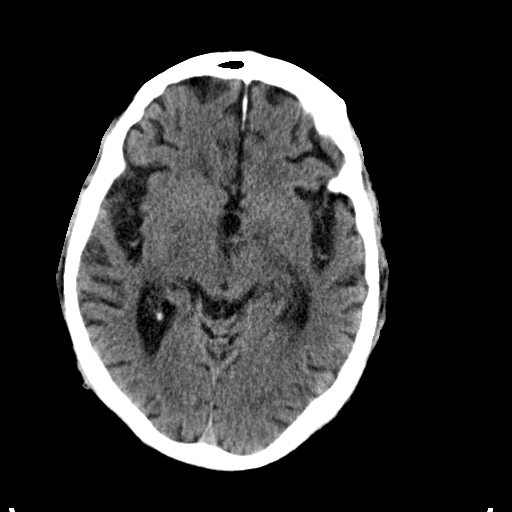
[im 16/31  brain]
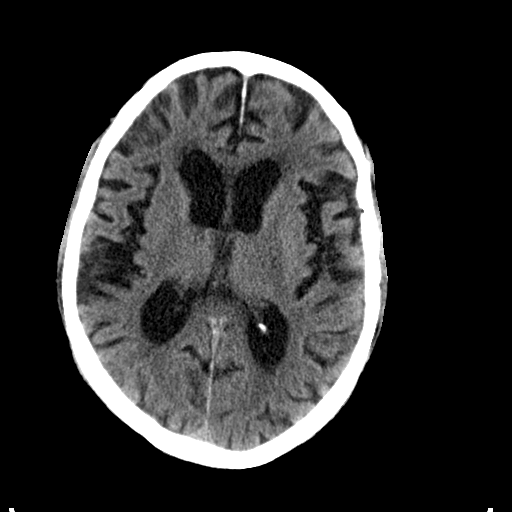
[im 18/31  brain]
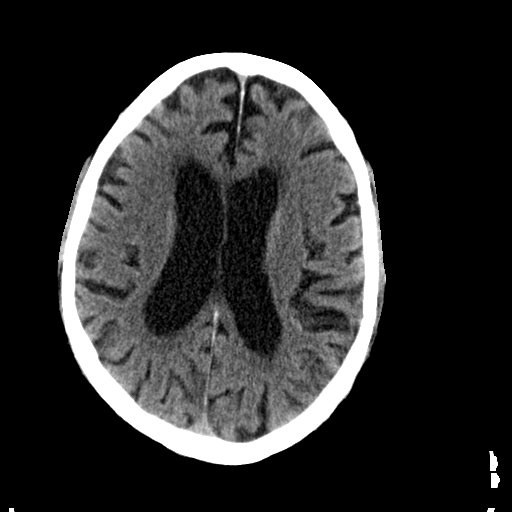
[im 20/31  brain]
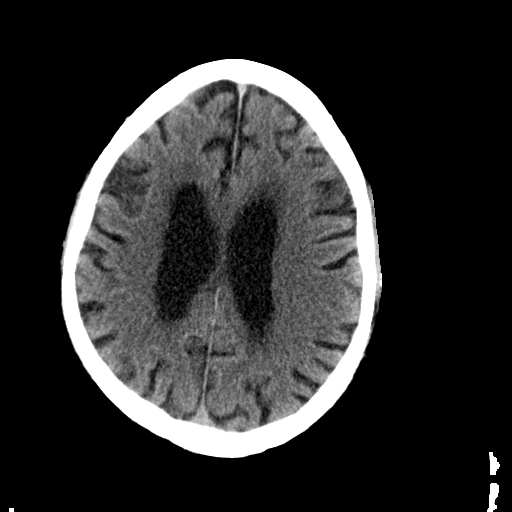
[im 20/31  bone]
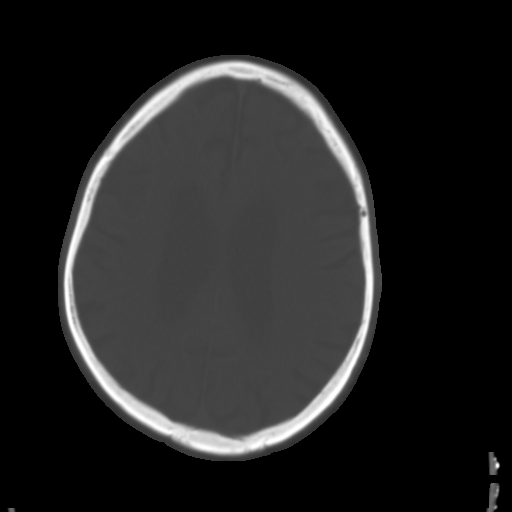
[im 22/31  brain]
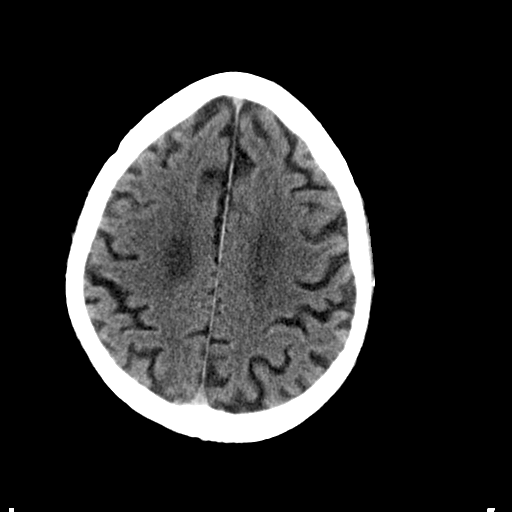
[im 24/31  brain]
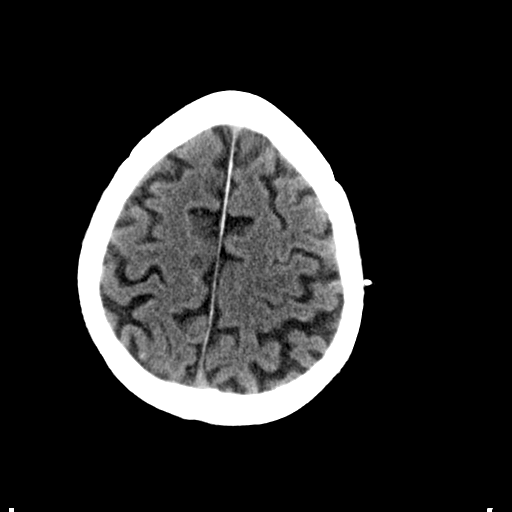
[im 26/31  brain]
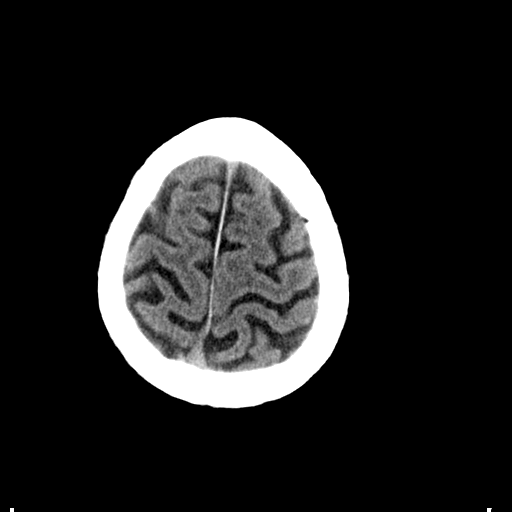
[im 28/31  brain]
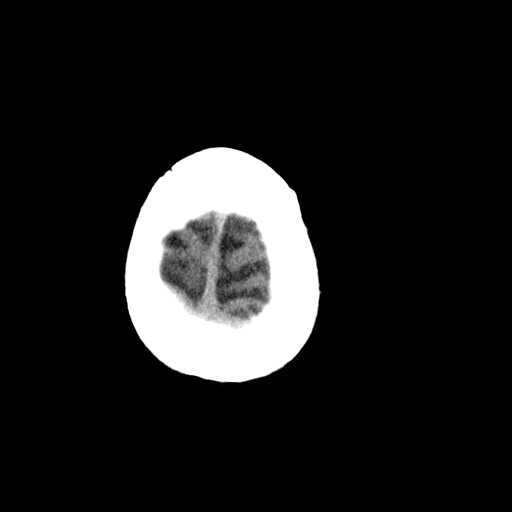
[im 28/31  bone]
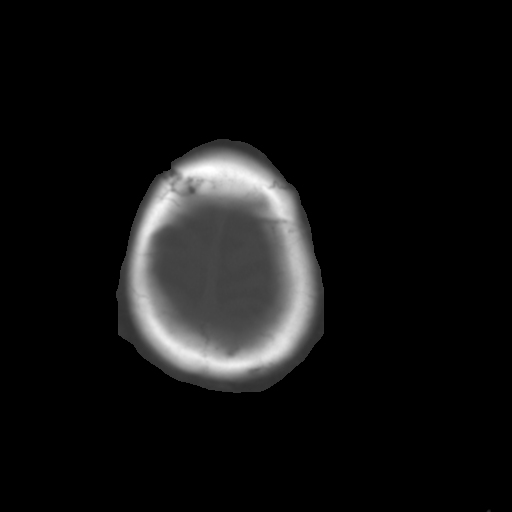

[Series 3: bone · axial · 0.46mm/px · z∈[+936,+976]mm · 3 of 31 slices shown]
[im 3/31  bone]
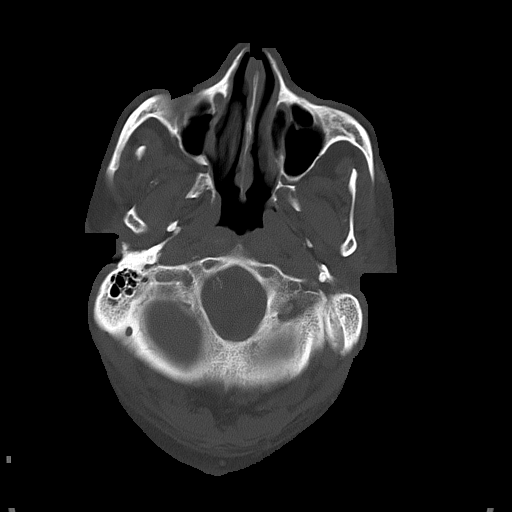
[im 7/31  bone]
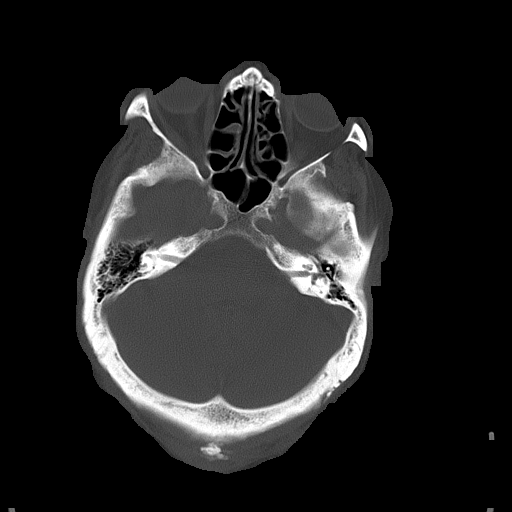
[im 11/31  bone]
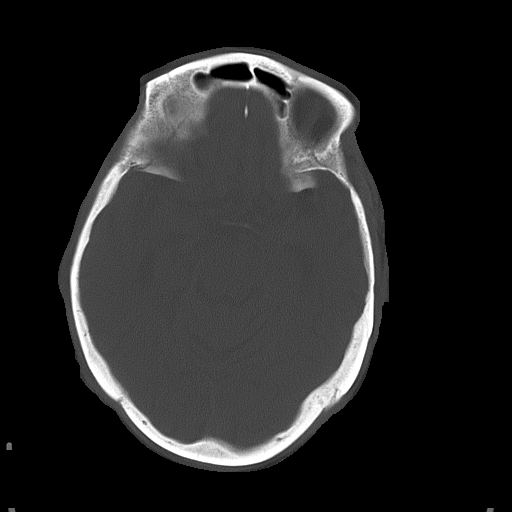

[16 of 30 positions shown; findings below may reference images not displayed]

FINDINGS: There is no evidence of intra-axial nor extra-axial fluid
collections nor evidence of acute hemorrhage. There is diffuse cortical
atrophy. Areas of low attenuation project within the subcortical, deep, and
periventricular white matter regions. Hydrocephalus ex vacuo is appreciated.
There is no evidence of subfalcine or tonsillar herniation. The cerebellum
and pons are grossly unremarkable. There is no evidence of a depressed skull
fracture.
IMPRESSION: Moderate to severe involutional changes without evidence of
focal or acute abnormalities.

## 2011-03-25 IMAGING — CR DG LUMBAR SPINE 2-3V
1 series · 3 of 3 positions shown · non-contrast
Comparison: none

REASON FOR EXAM: pain
COMMENTS:

[Series 1: view not recorded · 0.17mm/px · 3 of 3 slices shown]
[im 1/3]
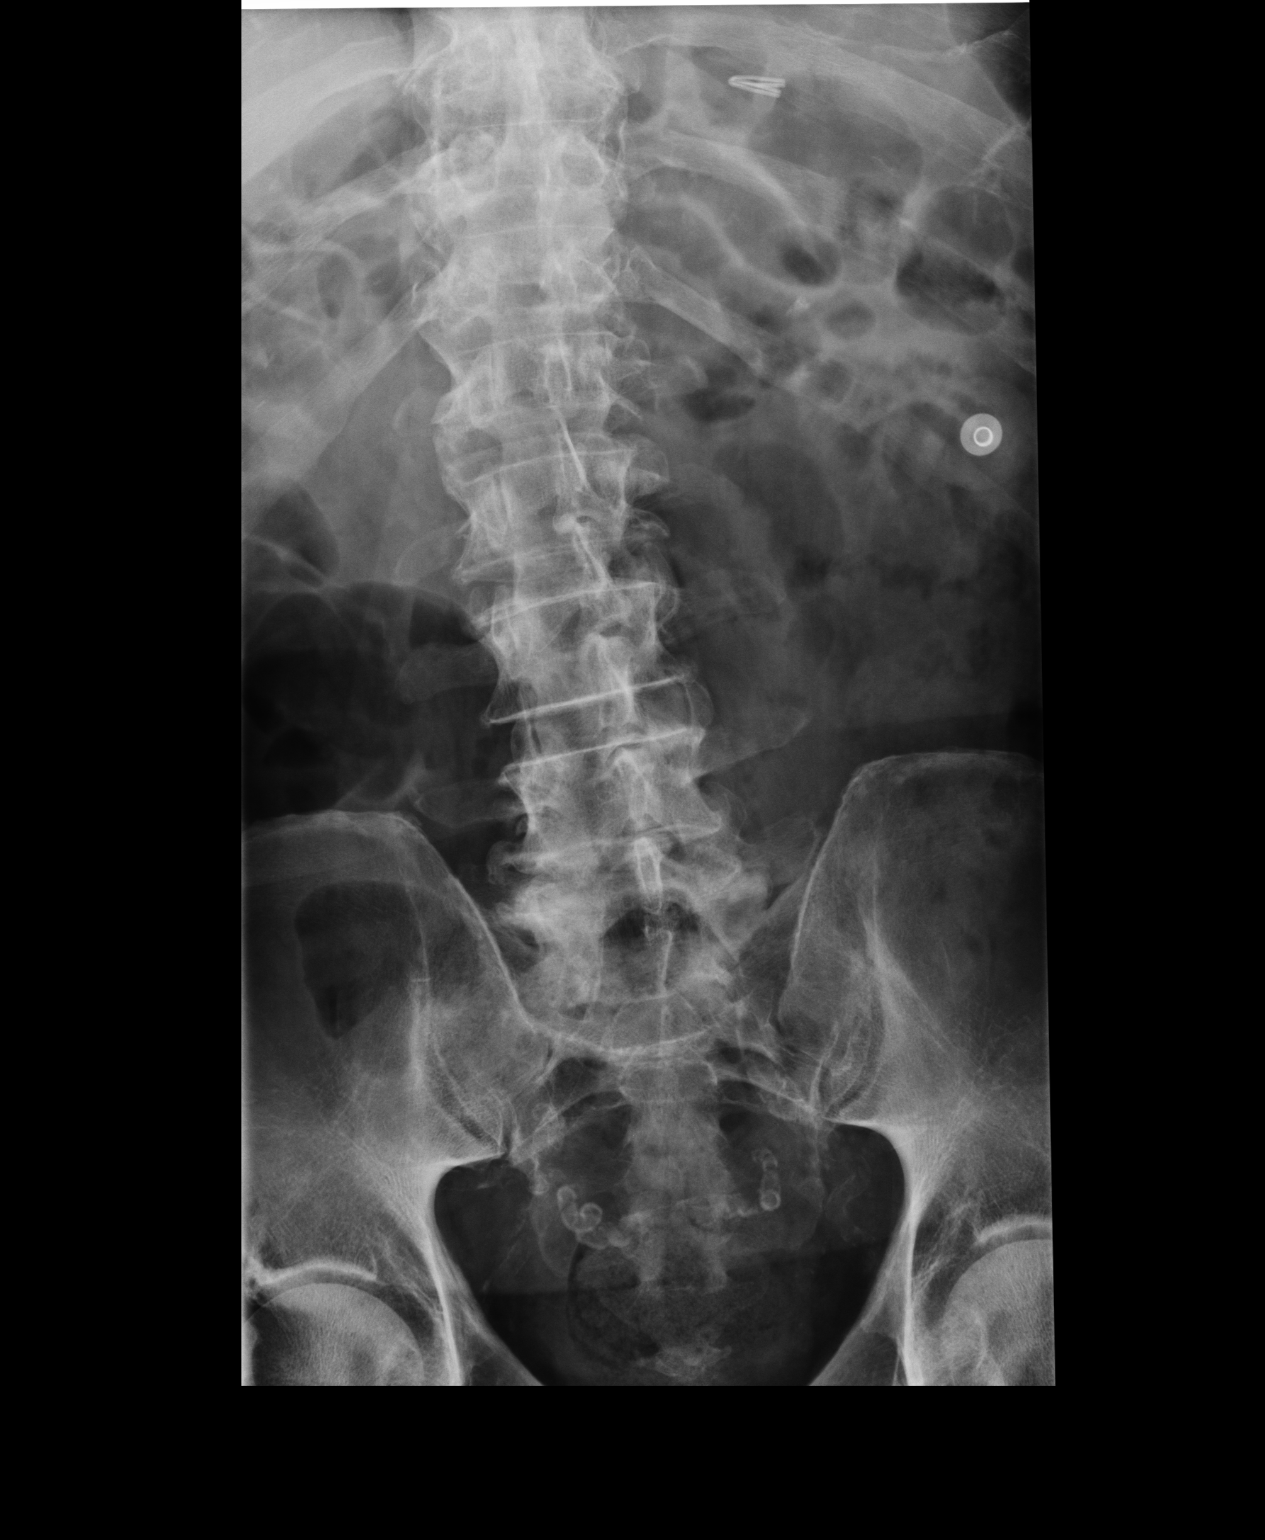
[im 2/3]
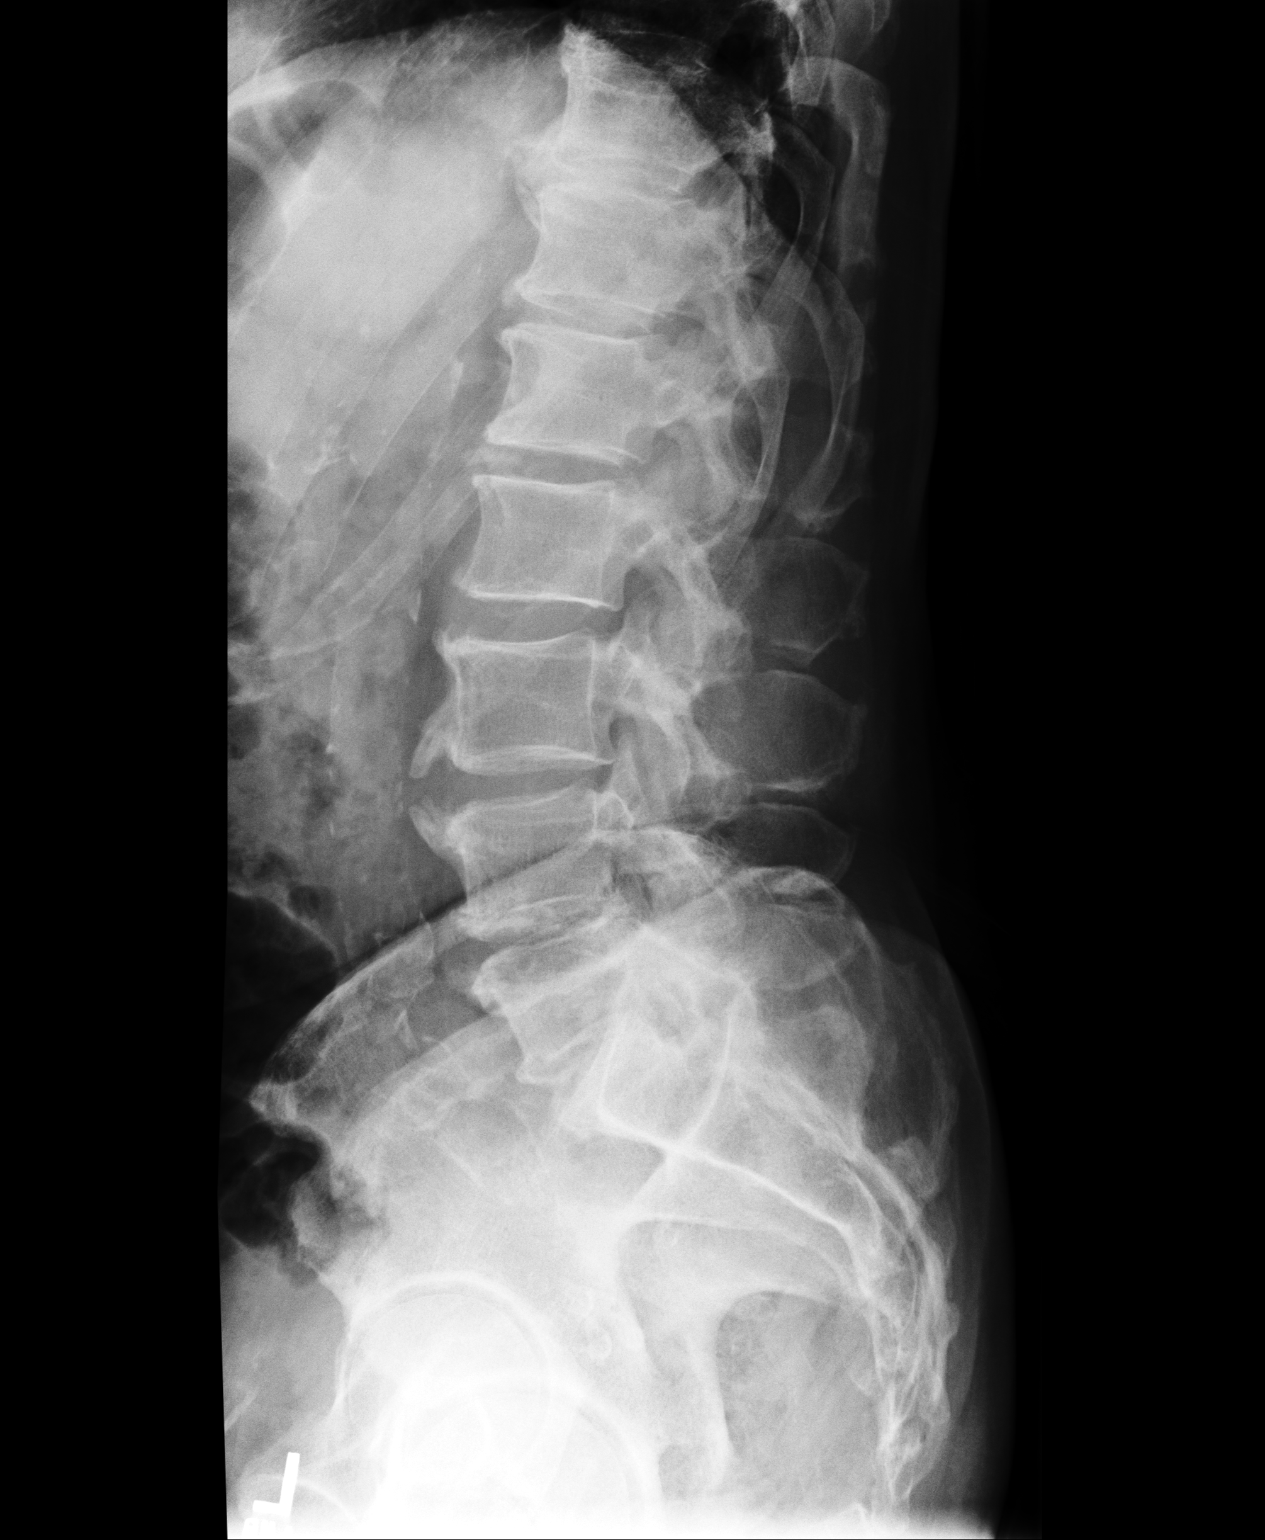
[im 3/3]
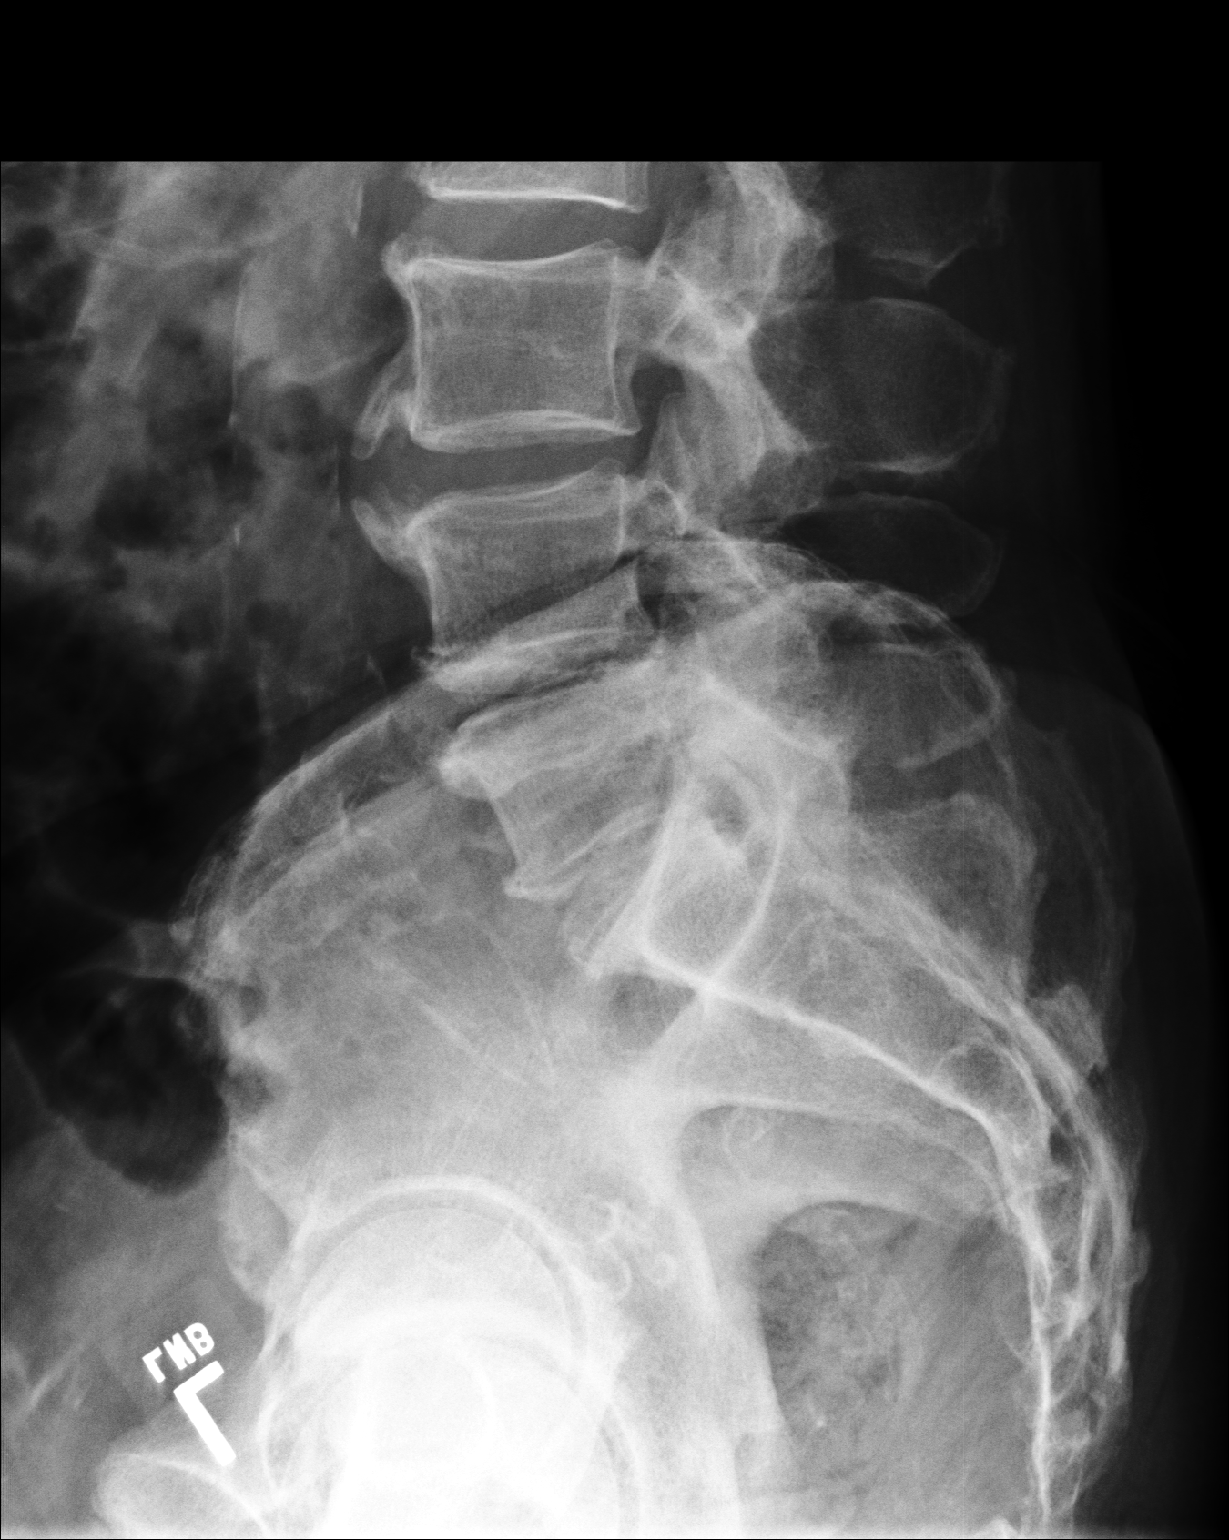

[3 of 3 positions shown; findings below may reference images not displayed]

PROCEDURE:     DXR - DXR LUMBAR SPINE AP AND LATERAL  - July 02, 2010  [DATE]

RESULT:     Five non-rib-bearing lumbar vertebral bodies are identified.
There is no evidence of acute fracture or dislocation. Degenerative disc
changes are appreciated within the lower lumbar spine most severe at L4-L5
and L5-S1 levels. Endplate hypertrophic spurring is appreciated at the L3-L4
level and to a lesser extent L4-L5 and L5-S1.
IMPRESSION: Degenerative disease changes within the lower lumbar spine without evidence
of acute osseous abnormalities.

## 2011-04-18 ENCOUNTER — Other Ambulatory Visit: Payer: Self-pay | Admitting: Family Medicine

## 2011-05-02 ENCOUNTER — Other Ambulatory Visit: Payer: Self-pay | Admitting: Family Medicine

## 2011-05-15 ENCOUNTER — Other Ambulatory Visit: Payer: Self-pay | Admitting: Family Medicine

## 2011-05-22 ENCOUNTER — Other Ambulatory Visit: Payer: Self-pay | Admitting: Family Medicine

## 2011-07-22 ENCOUNTER — Ambulatory Visit: Payer: Medicare Other

## 2011-07-22 ENCOUNTER — Other Ambulatory Visit (INDEPENDENT_AMBULATORY_CARE_PROVIDER_SITE_OTHER): Payer: Medicare Other

## 2011-07-22 ENCOUNTER — Other Ambulatory Visit: Payer: Medicare Other | Admitting: Family Medicine

## 2011-07-22 DIAGNOSIS — E119 Type 2 diabetes mellitus without complications: Secondary | ICD-10-CM

## 2011-07-22 DIAGNOSIS — E785 Hyperlipidemia, unspecified: Secondary | ICD-10-CM

## 2011-07-22 LAB — LIPID PANEL
Cholesterol: 214 mg/dL — ABNORMAL HIGH (ref 0–200)
HDL: 90.3 mg/dL (ref >39.00)

## 2011-07-22 LAB — COMPREHENSIVE METABOLIC PANEL
BUN: 22 mg/dL (ref 6–23)
CO2: 28 mEq/L (ref 19–32)
Creatinine, Ser: 1.2 mg/dL (ref 0.4–1.5)
GFR: 72.23 mL/min (ref >60.00)
Glucose, Bld: 127 mg/dL — ABNORMAL HIGH (ref 70–99)
Total Bilirubin: 1 mg/dL (ref 0.3–1.2)
Total Protein: 7.5 g/dL (ref 6.0–8.3)

## 2011-07-22 LAB — HEMOGLOBIN A1C: Hgb A1c MFr Bld: 6.3 % (ref 4.6–6.5)

## 2011-07-29 ENCOUNTER — Ambulatory Visit: Payer: Medicare Other | Admitting: Family Medicine

## 2011-07-31 ENCOUNTER — Encounter: Payer: Self-pay | Admitting: Family Medicine

## 2011-07-31 ENCOUNTER — Other Ambulatory Visit: Payer: Self-pay | Admitting: Family Medicine

## 2011-07-31 ENCOUNTER — Ambulatory Visit (INDEPENDENT_AMBULATORY_CARE_PROVIDER_SITE_OTHER): Payer: Medicare Other | Admitting: Family Medicine

## 2011-07-31 DIAGNOSIS — I10 Essential (primary) hypertension: Secondary | ICD-10-CM

## 2011-07-31 DIAGNOSIS — F1010 Alcohol abuse, uncomplicated: Secondary | ICD-10-CM

## 2011-07-31 DIAGNOSIS — E785 Hyperlipidemia, unspecified: Secondary | ICD-10-CM

## 2011-07-31 DIAGNOSIS — F068 Other specified mental disorders due to known physiological condition: Secondary | ICD-10-CM

## 2011-07-31 DIAGNOSIS — I2510 Atherosclerotic heart disease of native coronary artery without angina pectoris: Secondary | ICD-10-CM

## 2011-07-31 DIAGNOSIS — E119 Type 2 diabetes mellitus without complications: Secondary | ICD-10-CM

## 2011-07-31 DIAGNOSIS — N189 Chronic kidney disease, unspecified: Secondary | ICD-10-CM

## 2011-07-31 NOTE — Progress Notes (Signed)
  Subjective:    Patient ID: Tom Sanders, male    DOB: 10-12-1929, 77 y.o.   MRN: [id number]  HPI  Diabetes:  Well controlled. Lab Results  Component Value Date   HGBA1C 6.3 07/22/2011  Hypoglycemic episodes:? Hyperglycemic episodes:? Feet problems:None Blood Sugars averaging:Not checking. eye exam within last year:none  Hypertension:   Elevated today, was nml at last cardiology OV.. Per pt had terrible night ast night... Per daughter  Having a lot of anxiety, confusion in house. Pt denies.  On isosordil, losartan., HCTZ, tenormim, amlodipine.  Using medication without problems or lightheadedness: None Chest pain with exertion: None Edema:None Short of breath:None Average home BPs: not checking Other issues:  Elevated Cholesterol:Not at goal LDL <70 on  simvastatin 40 mg daily Using medications without problems:Yes Muscle aches: None Diet compliance: Poor Exercise: walking a lot Other complaints:  CAD: followed by Dr.  Anselmo Pickler. :LAst check 02/2011.. Stable no changes made. He is status post bypass surgery in 2002 at [facility].  Had a stress Myoview in 2010 which was negative.   CRI: stable at 1.2, potassium slightly low.  Dementia, alcoholic... Per pt still drinking a lot.. Pt denies consistently. Per daughter .Marland Kitchen Continues to have memory decline some steadily, no sudden change. Worse when drinking. Anger issues, anxiety...per orders daughter... No depression.  Review of Systems  Constitutional: Negative for fever and fatigue.  HENT: Negative for ear pain.   Eyes: Negative for pain.  Respiratory: Negative for cough and shortness of breath.   Cardiovascular: Negative for chest pain.  Gastrointestinal: Negative for abdominal distention.  Genitourinary: Negative for dysuria.  Psychiatric/Behavioral: Positive for confusion and agitation.       Objective:   Physical Exam  Constitutional: He is oriented to person, place, and time. Vital signs are normal. He appears  well-developed and well-nourished.       Overweight pleasant male in NAD  HENT:  Head: Normocephalic.  Right Ear: Hearing normal.  Left Ear: Hearing normal.  Nose: Nose normal.  Mouth/Throat: Oropharynx is clear and moist and mucous membranes are normal.  Neck: Trachea normal. Carotid bruit is not present. No mass and no thyromegaly present.  Cardiovascular: Normal rate, regular rhythm and normal pulses.  Exam reveals no gallop, no distant heart sounds and no friction rub.   No murmur heard.      No peripheral edema  Pulmonary/Chest: Effort normal and breath sounds normal. No respiratory distress.  Neurological: He is alert and oriented to person, place, and time. He has normal strength and normal reflexes. No sensory deficit. He displays a negative Romberg sign.  Skin: Skin is warm, dry and intact. No rash noted.  Psychiatric: He has a normal mood and affect. His speech is normal and behavior is normal. Thought content normal.          Assessment & Plan:

## 2011-07-31 NOTE — Assessment & Plan Note (Signed)
Continues, pt denies.

## 2011-07-31 NOTE — Assessment & Plan Note (Signed)
Encouraged exercise, weight loss, healthy eating habits. ? ?

## 2011-07-31 NOTE — Assessment & Plan Note (Signed)
Again not sure if taking meds correctly. Daughter will verify and we will check again in 3 months to see if at goal <70. If not change to lipitor.

## 2011-07-31 NOTE — Assessment & Plan Note (Signed)
Not sure if taking meds like he should. Daughter will try to monitor.  MAy need increase in amlodipine if BP remains high. Follow at home and call with results in next 1-2 weeks.

## 2011-07-31 NOTE — Assessment & Plan Note (Signed)
Continues to progress as pt continues to drink.  No other reversible causes seen in detail work up in last year.

## 2011-07-31 NOTE — Patient Instructions (Addendum)
Follow BP at home or pharmacy.. Call in next week or 2 with measurements.. Goal <130/80. Set out medications every day or night as directed on med sheet to verify taking meds appropriately.  We will re-eval BP and cholesterol at next visit.  Follow up in 3 months.. early February with fasting labs prior

## 2011-08-02 ENCOUNTER — Other Ambulatory Visit: Payer: Self-pay | Admitting: Family Medicine

## 2011-10-03 ENCOUNTER — Telehealth: Payer: Self-pay | Admitting: *Deleted

## 2011-10-03 NOTE — Telephone Encounter (Signed)
Please work him  with fasting labs prior for his 3 month DM visit, 30 min. He was supposed to schedule this in early Feb anyway.   I wanted to call daughter Gerarda Gunther who left message... No number left. In old message.. Daughter saundra at [phone], but got answering machine.  Will try again to reach ASAP on Monday. I need to talk to daughter over the phone prior to appt.

## 2011-10-03 NOTE — Telephone Encounter (Signed)
Patient daughter called to let you know that patient is not taken his medication  And is drinking a lot. Patient is not sleeping at all. Patient is being aggressive and patient would like advise on if you would like to see him before you go out or what can be done.   Pharmacy: [facility] [address]

## 2011-10-06 NOTE — Telephone Encounter (Signed)
Spoke with patients wife and she said patients daughter at work and it is okay to call daughter at [phone] b/c she will be home this afternoon and tomorrow
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
## 2011-10-08 NOTE — Telephone Encounter (Signed)
Patients wife advised and appt made for February 1st at 11:45 Tom Sanders also advised via message on home machine

## 2011-10-17 ENCOUNTER — Ambulatory Visit: Payer: Medicare Other | Admitting: Family Medicine

## 2011-10-20 ENCOUNTER — Ambulatory Visit: Payer: Medicare Other | Admitting: Family Medicine

## 2011-10-22 ENCOUNTER — Ambulatory Visit (INDEPENDENT_AMBULATORY_CARE_PROVIDER_SITE_OTHER): Payer: Medicare Other | Admitting: Family Medicine

## 2011-10-22 ENCOUNTER — Encounter: Payer: Self-pay | Admitting: Family Medicine

## 2011-10-22 VITALS — BP 130/90 | HR 88 | Temp 97.9°F | Ht 66.0 in | Wt 174.8 lb

## 2011-10-22 DIAGNOSIS — R358 Other polyuria: Secondary | ICD-10-CM

## 2011-10-22 DIAGNOSIS — N189 Chronic kidney disease, unspecified: Secondary | ICD-10-CM

## 2011-10-22 DIAGNOSIS — E785 Hyperlipidemia, unspecified: Secondary | ICD-10-CM

## 2011-10-22 DIAGNOSIS — Z79899 Other long term (current) drug therapy: Secondary | ICD-10-CM

## 2011-10-22 DIAGNOSIS — F1027 Alcohol dependence with alcohol-induced persisting dementia: Secondary | ICD-10-CM

## 2011-10-22 DIAGNOSIS — F1010 Alcohol abuse, uncomplicated: Secondary | ICD-10-CM

## 2011-10-22 DIAGNOSIS — E119 Type 2 diabetes mellitus without complications: Secondary | ICD-10-CM

## 2011-10-22 DIAGNOSIS — I2510 Atherosclerotic heart disease of native coronary artery without angina pectoris: Secondary | ICD-10-CM

## 2011-10-22 DIAGNOSIS — I10 Essential (primary) hypertension: Secondary | ICD-10-CM

## 2011-10-22 LAB — CBC WITH DIFFERENTIAL/PLATELET
Basophils Absolute: 0 10*3/uL (ref 0.0–0.1)
Eosinophils Absolute: 0.2 10*3/uL (ref 0.0–0.7)
HCT: 39.2 % (ref 39.0–52.0)
Hemoglobin: 13.6 g/dL (ref 13.0–17.0)
Lymphocytes Relative: 24.6 % (ref 12.0–46.0)
Lymphs Abs: 1.6 10*3/uL (ref 0.7–4.0)
MCHC: 34.7 g/dL (ref 30.0–36.0)
Neutro Abs: 4.1 10*3/uL (ref 1.4–7.7)
RDW: 12.9 % (ref 11.5–14.6)

## 2011-10-22 LAB — BASIC METABOLIC PANEL
BUN: 17 mg/dL (ref 6–23)
CO2: 25 mEq/L (ref 19–32)
Calcium: 9.1 mg/dL (ref 8.4–10.5)
Glucose, Bld: 93 mg/dL (ref 70–99)
Sodium: 139 mEq/L (ref 135–145)

## 2011-10-22 LAB — HEPATIC FUNCTION PANEL: Albumin: 4.1 g/dL (ref 3.5–5.2)

## 2011-10-22 LAB — HEMOGLOBIN A1C: Hgb A1c MFr Bld: 5.9 % (ref 4.6–6.5)
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
## 2011-10-22 NOTE — Patient Instructions (Signed)
STOP HYDROCHLOROTHIAZIDE
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
## 2011-11-12 DIAGNOSIS — Z0289 Encounter for other administrative examinations: Secondary | ICD-10-CM

## 2011-11-13 ENCOUNTER — Emergency Department: Payer: Self-pay | Admitting: Emergency Medicine

## 2011-11-13 LAB — CBC
HCT: 38 % — ABNORMAL LOW (ref 40.0–52.0)
HGB: 13 g/dL (ref 13.0–18.0)
MCHC: 34.2 g/dL (ref 32.0–36.0)
MCV: 88 fL (ref 80–100)
RDW: 12.5 % (ref 11.5–14.5)
WBC: 8.3 10*3/uL (ref 3.8–10.6)

## 2011-11-13 LAB — URINALYSIS, COMPLETE
Bilirubin,UR: NEGATIVE
Hyaline Cast: 4
Leukocyte Esterase: NEGATIVE
Protein: NEGATIVE
RBC,UR: <1 /HPF (ref 0–5)
Squamous Epithelial: NONE SEEN
WBC UR: 2 /HPF (ref 0–5)

## 2011-11-13 LAB — COMPREHENSIVE METABOLIC PANEL
Albumin: 4.1 g/dL (ref 3.4–5.0)
Alkaline Phosphatase: 52 U/L (ref 50–136)
BUN: 17 mg/dL (ref 7–18)
Bilirubin,Total: 0.6 mg/dL (ref 0.2–1.0)
Co2: 26 mmol/L (ref 21–32)
Creatinine: 1.47 mg/dL — ABNORMAL HIGH (ref 0.60–1.30)
EGFR (Non-African Amer.): 49 — ABNORMAL LOW
Osmolality: 289 (ref 275–301)
SGPT (ALT): 21 U/L
Sodium: 143 mmol/L (ref 136–145)
Total Protein: 8.3 g/dL — ABNORMAL HIGH (ref 6.4–8.2)

## 2011-11-13 LAB — DRUG SCREEN, URINE
Amphetamines, Ur Screen: NEGATIVE (ref ?–1000)
Barbiturates, Ur Screen: NEGATIVE (ref ?–200)
Benzodiazepine, Ur Scrn: NEGATIVE (ref ?–200)
Cocaine Metabolite,Ur ~~LOC~~: NEGATIVE (ref ?–300)
Methadone, Ur Screen: NEGATIVE (ref ?–300)
Opiate, Ur Screen: NEGATIVE (ref ?–300)
Phencyclidine (PCP) Ur S: NEGATIVE (ref ?–25)
Tricyclic, Ur Screen: NEGATIVE (ref ?–1000)

## 2011-11-13 LAB — ETHANOL: Ethanol %: <0.003 % (ref 0.000–0.080)

## 2011-11-14 ENCOUNTER — Telehealth: Payer: Self-pay | Admitting: Family Medicine

## 2011-11-14 NOTE — Telephone Encounter (Signed)
Late entry.  Call from EDP at [facility].  Pt was possibly intoxicated prev and had been in argument with family.  Had stabilized at ER and was going to be sent home.  He called for info on patient.  We talked about options.  He was going to send pt home on trazodone for sleep after discussing with psych and then try to set f/u at [facility].  Will notify PMD as a FYI.

## 2011-11-15 LAB — COMPREHENSIVE METABOLIC PANEL
Albumin: 4 g/dL (ref 3.4–5.0)
Alkaline Phosphatase: 50 U/L (ref 50–136)
Anion Gap: 13 (ref 7–16)
BUN: 11 mg/dL (ref 7–18)
Bilirubin,Total: 0.8 mg/dL (ref 0.2–1.0)
Calcium, Total: 9.5 mg/dL (ref 8.5–10.1)
Chloride: 103 mmol/L (ref 98–107)
Creatinine: 0.94 mg/dL (ref 0.60–1.30)
EGFR (African American): >60
EGFR (Non-African Amer.): >60
Glucose: 112 mg/dL — ABNORMAL HIGH (ref 65–99)
Osmolality: 283 (ref 275–301)
Potassium: 3.3 mmol/L — ABNORMAL LOW (ref 3.5–5.1)
Sodium: 142 mmol/L (ref 136–145)
Total Protein: 8.5 g/dL — ABNORMAL HIGH (ref 6.4–8.2)

## 2011-11-15 LAB — CBC WITH DIFFERENTIAL/PLATELET
Basophil #: 0 10*3/uL (ref 0.0–0.1)
Comment - H1-Com1: NORMAL
Eosinophil #: 0.2 10*3/uL (ref 0.0–0.7)
Lymphocyte #: 1.7 10*3/uL (ref 1.0–3.6)
Lymphocytes: 13 %
MCH: 29.7 pg (ref 26.0–34.0)
MCHC: 33.8 g/dL (ref 32.0–36.0)
MCV: 88 fL (ref 80–100)
Monocyte #: 0.6 10*3/uL (ref 0.0–0.7)
Monocyte %: 6.7 %
Monocytes: 8 %
Neutrophil #: 6.1 10*3/uL (ref 1.4–6.5)
Neutrophil %: 70.6 %
Platelet: 140 10*3/uL — ABNORMAL LOW (ref 150–440)
RBC: 5.16 10*6/uL (ref 4.40–5.90)
RDW: 12.2 % (ref 11.5–14.5)
Segmented Neutrophils: 72 %
WBC: 8.6 10*3/uL (ref 3.8–10.6)

## 2011-11-17 NOTE — Telephone Encounter (Signed)
Reviewed

## 2011-11-18 LAB — URINALYSIS, COMPLETE
Bacteria: NONE SEEN
Bilirubin,UR: NEGATIVE
Glucose,UR: NEGATIVE mg/dL (ref 0–75)
Leukocyte Esterase: NEGATIVE
Protein: 30
RBC,UR: 1 /HPF (ref 0–5)
Specific Gravity: 1.026 (ref 1.003–1.030)
Squamous Epithelial: NONE SEEN

## 2012-06-01 ENCOUNTER — Encounter: Payer: Self-pay | Admitting: Family Medicine

## 2012-06-01 ENCOUNTER — Ambulatory Visit (INDEPENDENT_AMBULATORY_CARE_PROVIDER_SITE_OTHER): Payer: Medicare Other | Admitting: Family Medicine

## 2012-06-01 VITALS — BP 129/65 | HR 69 | Temp 97.6°F | Ht 66.0 in | Wt 172.5 lb

## 2012-06-01 DIAGNOSIS — E119 Type 2 diabetes mellitus without complications: Secondary | ICD-10-CM

## 2012-06-01 DIAGNOSIS — I10 Essential (primary) hypertension: Secondary | ICD-10-CM

## 2012-06-01 DIAGNOSIS — G4700 Insomnia, unspecified: Secondary | ICD-10-CM

## 2012-06-01 DIAGNOSIS — F1027 Alcohol dependence with alcohol-induced persisting dementia: Secondary | ICD-10-CM

## 2012-06-01 DIAGNOSIS — E559 Vitamin D deficiency, unspecified: Secondary | ICD-10-CM

## 2012-06-01 DIAGNOSIS — E785 Hyperlipidemia, unspecified: Secondary | ICD-10-CM

## 2012-06-01 DIAGNOSIS — E039 Hypothyroidism, unspecified: Secondary | ICD-10-CM

## 2012-06-01 DIAGNOSIS — F1010 Alcohol abuse, uncomplicated: Secondary | ICD-10-CM

## 2012-06-01 MED ORDER — LACTULOSE 20 GM/30ML PO SOLN: 20.0000 g | Freq: Two times a day (BID) | ORAL | Status: AC

## 2012-06-01 MED ORDER — ERGOCALCIFEROL 1.25 MG (50000 UT) PO CAPS: 50000.0000 [IU] | ORAL_CAPSULE | ORAL | Status: AC

## 2012-06-01 MED ORDER — ATENOLOL 25 MG PO TABS: 25.0000 mg | ORAL_TABLET | Freq: Every day | ORAL | Status: AC

## 2012-06-01 MED ORDER — PRENATAL 27-0.8 MG PO TABS
1.0000 | ORAL_TABLET | Freq: Every day | ORAL | Status: AC
Start: 2012-06-01 — End: ?

## 2012-06-01 MED ORDER — TRAZODONE HCL 50 MG PO TABS: 25.0000 mg | ORAL_TABLET | Freq: Every evening | ORAL | Status: AC | PRN

## 2012-06-01 NOTE — Patient Instructions (Addendum)
Start vit D supplement weekly,x 12 weeks when completed change to daily vit D OTC 400 IU twice daily.  Continue current medicaitons.  Can try trazodone for sleep at night as needed. Get more active during day.

## 2012-06-01 NOTE — Progress Notes (Signed)
  Subjective:    Patient ID: Tom Sanders, male    DOB: 02-Apr-1930, 78 y.o.   MRN: [id number]  HPI  78 year old male with alcoholic dementia and resulting confusion and aggressive behaviors presents with daughter for follow up following [facility] hospitalization (for alcohol detox) in 11/2011 followed by 6 month stay at [facility]).  He was discharged 05/26/2012.  Full notes were reviiewed.  He was taken off his  BP med because when it was administered his BP dropped low (he likely was not taking at home). His BP is now maintained only on atenolol.  He was changed to prilosec in place of nexium for GERD, well controlled.  For agressive behavoirs  Associated with his alcoholic dementia, he has been place on zyprexa as needed for aggression. Daughter has stated they have not had to use this med. Per daughter mood is fairly decent..no problems taking meds. Cannot sleep at night. Napping during the day resultingly. Confusion stable now at home. Some loss of control with urine.. Wearing pad continuously. No alcohol in the house. No acsess to checkbook, no acsess to car.  MRI brain showed atrophy.   In hospital his ammonia  (high ggt) was elevated at 127.. [place] of liver showed no cirrhosis: started on lactulose daily.  At last check lactulose 70. Statin stopped due to liver issues.  Labs before discharge 9/13 showed hg 12.0, plt 176, glucose 132, cr 1.17, AST34, ALT high at 53 Lipids total 163, tri 81, HDL 69, LDL 78 A1C 6.2 TSH 4.9 Vit D low at 19.4  Daughter is working on getting him set up with [facility] care.. For 2-3 days a week 5-8 hours a day. Has home health coming, to be set up. No falls since home.   Wt Readings from Last 3 Encounters:  06/01/12 172 lb 8 oz (78.245 kg)  10/22/11 174 lb 12.8 oz (79.289 kg)  07/31/11 179 lb 12.8 oz (81.557 kg)       Review of Systems  Constitutional: Negative for fever and fatigue.  HENT: Negative for ear pain  and congestion.   Eyes: Negative for pain.  Respiratory: Negative for shortness of breath.   Cardiovascular: Negative for chest pain.  Gastrointestinal: Negative for abdominal pain.       Objective:   Physical Exam  Constitutional: Vital signs are normal. He appears well-developed and well-nourished.       Overweight pleasant male in NAD  HENT:  Head: Normocephalic.  Right Ear: Hearing normal.  Left Ear: Hearing normal.  Nose: Nose normal.  Mouth/Throat: Oropharynx is clear and moist and mucous membranes are normal.  Neck: Trachea normal. Carotid bruit is not present. No mass and no thyromegaly present.  Cardiovascular: Normal rate, regular rhythm and normal pulses.  Exam reveals no gallop, no distant heart sounds and no friction rub.   No murmur heard.      No peripheral edema  Pulmonary/Chest: Effort normal and breath sounds normal. No respiratory distress.  Neurological: He is alert. He has normal strength and normal reflexes. No sensory deficit. He displays a negative Romberg sign.       Minimally interactive during OV, spends time sleeping, responsive to questions when directed at him.  Skin: Skin is warm, dry and intact. No rash noted.  Psychiatric: He has a normal mood and affect. His speech is normal and behavior is normal. Thought content normal.          Assessment & Plan:

## 2012-06-01 NOTE — Progress Notes (Signed)
[facility]

## 2012-06-09 ENCOUNTER — Telehealth: Payer: Self-pay | Admitting: Family Medicine

## 2012-06-09 NOTE — Telephone Encounter (Signed)
Received records request accompanied by a blank Power of Attorney without patients signature or name listed. Called [place] to confirm validity, was directed to [employer] [place] [phone] confirmed POA, provided confirmation of POA, verification of the raised seal on the Letter of Appointment for General Guardian and the bond process date of 01/23/2012 to Rebecca Motta (daughter).We also have the [place] Division of Mental Health /Development Disabilities/Substance Abuse Services form giving consent to both daughters,Allie Mae & Dois Davenport. Request will be honored and forwarded to [facility] for processing. rmf

## 2012-06-10 ENCOUNTER — Telehealth: Payer: Self-pay

## 2012-06-10 NOTE — Telephone Encounter (Signed)
[facility] request  Verbal order for eval for PT and OT for pt to benefit from available equipment and instruction.Please advise.
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
## 2012-06-10 NOTE — Telephone Encounter (Signed)
Okay to give requested verbal order.

## 2012-06-11 NOTE — Telephone Encounter (Signed)
Yes.. Obtain an clean cath urine sample and may have verbal oder to continue services as requested.

## 2012-06-14 DIAGNOSIS — E559 Vitamin D deficiency, unspecified: Secondary | ICD-10-CM | POA: Insufficient documentation

## 2012-06-14 DIAGNOSIS — G4700 Insomnia, unspecified: Secondary | ICD-10-CM | POA: Insufficient documentation

## 2012-06-14 NOTE — Telephone Encounter (Signed)
LMOVM of Tom Sanders's VM.

## 2012-06-14 NOTE — Assessment & Plan Note (Signed)
No evidence of cirrhosis on imaging studies, but ammonia level high in hospital. Recheck levels and continue daily lactulose.

## 2012-06-14 NOTE — Assessment & Plan Note (Signed)
Statin held given elevated ammonia level in hosptial. Will continue to hold to simplify regimen given age and alcholoic dementia.

## 2012-06-14 NOTE — Assessment & Plan Note (Signed)
Start vit D supplement weekly,x 12 weeks when completed change to daily vit D OTC 400 IU twice daily.

## 2012-06-14 NOTE — Assessment & Plan Note (Signed)
No med needed with weight loss and no alcohol intake.

## 2012-06-14 NOTE — Assessment & Plan Note (Signed)
Off all BP meds except atenolol given hypotension in hospital when they were started suggesting that pt was not comoiant at home with med regimen.

## 2012-06-14 NOTE — Assessment & Plan Note (Addendum)
Has zyprexa to use prn aggressive behavoirs. dughter has not had to use this since he has been home. He denies any pain.  No longer using alcohol per daughter as he has no access to alcohol or money, or car.

## 2012-06-14 NOTE — Assessment & Plan Note (Signed)
Can try trazodone for sleep at night as needed. Get more active during day. Sleep hygiene reviewed.

## 2012-06-16 ENCOUNTER — Telehealth: Payer: Self-pay | Admitting: Family Medicine

## 2012-06-16 NOTE — Telephone Encounter (Signed)
Caller: Nancy/Home Health Nurse with [facility]; Patient Name: Tom Sanders; PCP: [name] [facility]); Best Callback Phone Number: [phone] Patient having trouble sleeping . Nurse reports family has been giving patient Trazedone at 1/2 tab starting on 06/01/12 for sleep.  Directions for Trazedone are 0.5 to 1 tablet ( 25-50mg ) by mouth at bedtime as needed for sleep.On Sunday, 06/13/12, family started patient on 1 whole tab of Trazedone due to difficulty sleeping continuing on half tab.  Since starting whole tab on 06/13/12, patient has continued to have nights of interrupted sleep until last night.  Patient slept approximately 5 hours last night. Home Health  Nurse asking if there is another option of sleeping medication or medication to relax patient to help with sleep. Family verbalizing concern about patient being up and down during the night with his Dementia to Home Health Nurse  Will forward message ( in EPIC) to Dr. Pattricia Boss to advise.

## 2012-06-17 NOTE — Telephone Encounter (Signed)
Before changing meds.. Since trazodone is safest in this pt.Marland KitchenMarland KitchenI would have him try 100 mg at bedtime for at least 1-2 weeks, if no SE.

## 2012-06-17 NOTE — Telephone Encounter (Signed)
Tom Sanders advised.  She states she will relay message to the family.

## 2012-06-18 DIAGNOSIS — F0150 Vascular dementia without behavioral disturbance: Secondary | ICD-10-CM

## 2012-06-18 DIAGNOSIS — G309 Alzheimer's disease, unspecified: Secondary | ICD-10-CM

## 2012-06-18 DIAGNOSIS — I10 Essential (primary) hypertension: Secondary | ICD-10-CM

## 2012-06-18 DIAGNOSIS — F0280 Dementia in other diseases classified elsewhere without behavioral disturbance: Secondary | ICD-10-CM

## 2012-07-22 DIAGNOSIS — Z0279 Encounter for issue of other medical certificate: Secondary | ICD-10-CM

## 2012-07-23 ENCOUNTER — Telehealth: Payer: Self-pay

## 2012-07-23 NOTE — Telephone Encounter (Signed)
pts daughter said she had gotten call paperwork was ready for pick up and she will pick up on 07/30/12.

## 2012-08-31 ENCOUNTER — Ambulatory Visit: Payer: Medicare Other | Admitting: Family Medicine

## 2012-08-31 DIAGNOSIS — Z0289 Encounter for other administrative examinations: Secondary | ICD-10-CM

## 2012-11-26 ENCOUNTER — Emergency Department: Payer: Self-pay | Admitting: Unknown Physician Specialty

## 2012-11-26 LAB — CBC
HCT: 34 % — ABNORMAL LOW (ref 40.0–52.0)
HGB: 11.6 g/dL — ABNORMAL LOW (ref 13.0–18.0)
MCHC: 34.1 g/dL (ref 32.0–36.0)
MCV: 86 fL (ref 80–100)
Platelet: 141 10*3/uL — ABNORMAL LOW (ref 150–440)
RBC: 3.97 10*6/uL — ABNORMAL LOW (ref 4.40–5.90)
RDW: 13.2 % (ref 11.5–14.5)
WBC: 8.5 10*3/uL (ref 3.8–10.6)

## 2012-11-26 LAB — COMPREHENSIVE METABOLIC PANEL
Alkaline Phosphatase: 72 U/L (ref 50–136)
BUN: 18 mg/dL (ref 7–18)
Bilirubin,Total: 0.4 mg/dL (ref 0.2–1.0)
Chloride: 109 mmol/L — ABNORMAL HIGH (ref 98–107)
EGFR (African American): >60
Glucose: 102 mg/dL — ABNORMAL HIGH (ref 65–99)
Osmolality: 281 (ref 275–301)
Potassium: 3.8 mmol/L (ref 3.5–5.1)
SGOT(AST): 26 U/L (ref 15–37)
SGPT (ALT): 29 U/L (ref 12–78)
Sodium: 140 mmol/L (ref 136–145)

## 2012-11-26 LAB — TROPONIN I: Troponin-I: <0.02 ng/mL

## 2012-11-26 LAB — CK TOTAL AND CKMB (NOT AT ARMC)
CK, Total: 119 U/L (ref 35–232)
CK-MB: 0.5 ng/mL (ref 0.5–3.6)

## 2014-10-29 ENCOUNTER — Inpatient Hospital Stay: Payer: Self-pay | Admitting: Specialist

## 2014-11-03 ENCOUNTER — Telehealth: Payer: Self-pay | Admitting: Family Medicine

## 2014-11-03 NOTE — Telephone Encounter (Signed)
i called to confirm pt wanted to cancel his appointment with you on Tuesday 2/23.  Per pt spouse mr Clinton Sawyerwilliamson has been going [facility] for the last to years.  i took your name off and PCP

## 2014-11-07 ENCOUNTER — Ambulatory Visit: Payer: Self-pay | Admitting: Family Medicine
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
## 2015-01-14 NOTE — H&P (Signed)
PATIENT NAME:  Tom DammeWILLIAMSON, Cleburn D MR#:  [id number] DATE OF BIRTH:  [birth year]  DATE OF ADMISSION:  10/29/2014  PRIMARY CARE PHYSICIAN: Excell SeltzerAmy E. Bedsole, MD   CHIEF COMPLAINT: Cough, congestion, and fever.   HISTORY OF PRESENT ILLNESS: This is an 81 year old male who presents to the hospital due to cough, congestion and not feeling well for the past 2 days. The patient himself has advanced dementia; therefore, most of the history obtained from the son at bedside. As per the son, the patient has not been feeling well for the past 2 days with cough, congestion and upper airway rattling. He was also not eating and drinking well and therefore he was brought to the ER. At triage, the patient was noted to have a temperature of 101. His chest x-ray findings were suggestive of left middle lobe and lower lobe pneumonias. Hospitalist services were contacted for further treatment and evaluation.   REVIEW OF SYSTEMS: CONSTITUTIONAL: Positive for documented fever of 101. Positive generalized weakness. No weight gain, no weight loss.  EYES: No blurry or double vision.  ENT: No tinnitus. No postnasal drip. No redness of the oropharynx. RESPIRATORY: Positive cough. No wheeze, no hemoptysis.  CARDIOVASCULAR: No chest pain, no orthopnea, no palpitations, no syncope.  GASTROINTESTINAL: No nausea, no vomiting, bi diarrhea, no abdominal pain. No melena, no hematochezia.  GENITOURINARY: No dysuria or hematuria.  ENDOCRINE: No polyuria or nocturia,  heat or cold intolerance. HEMATOLOGIC: No anemia. No bruising. No bleeding.  INTEGUMENTARY: No rashes. No lesions.  MUSCULOSKELETAL: No arthritis. No swelling. No gout.  NEUROLOGIC: No numbness, tingling. No ataxia. No seizure-type activity.  PSYCHIATRIC: No anxiety no insomnia, no ADD. Positive depression.   PAST MEDICAL HISTORY: Consistent with a history of advanced dementia, hypertension, history of previous coronary artery disease, status post bypass, GERD.    ALLERGIES: No known drug allergies.   SOCIAL HISTORY: Used to be a smoker, quit many years ago. No alcohol abuse. No illicit drug abuse. Lives with his son.   FAMILY HISTORY: Mother and father both deceased, both died from old age.   CURRENT MEDICATIONS: [facility]. We cannot obtain his current medication list as they are closed this. This is to be done probably tomorrow.   PHYSICAL EXAMINATION: Presently is as follows:  VITAL SIGNS: Noted to be: Temperature is 101.4, pulse 84, respirations 18, blood pressure 139/68, saturation is 99% on room air.  GENERAL: He is a pleasant-appearing male but in no apparent distress.  HEENT: Atraumatic, normocephalic. Extraocular muscles are intact. Pupils equal and reactive to light. Sclerae are anicteric. No conjunctival injection. No pharyngeal erythema.  NECK: Supple. There is no jugular venous distention. No bruits. No lymphadenopathy or thyromegaly.  HEART: Regular rate and rhythm. No murmurs, no rubs, no clicks.  LUNGS: Clear to auscultation bilaterally. He has poor respiratory effort. Negative use of accessory muscles. No dullness to percussion.  ABDOMEN: Soft, flat, nontender, nondistended. Has good bowel sounds. No hepatosplenomegaly appreciated.  EXTREMITIES: No evidence of any cyanosis, clubbing, or peripheral edema. Has +2 pedal and radial pulses bilaterally.  NEUROLOGICAL: The patient is alert, awake, oriented x 1. Moves all extremities spontaneously, globally weak. No other focal motor or sensory deficits appreciated bilaterally.  SKIN: Moist and warm with no rashes.  LYMPHATIC: There is no cervical or axillary lymphadenopathy.   LABORATORY DATA: Showed a serum glucose of 183, BUN 16, creatinine 1.3, sodium 138, potassium 3.8, chloride 105, bicarbonate 27. LFTs are within normal  limits. Troponin less than 0.02. White cell count of 13.2, hemoglobin 12.1, hematocrit 37.1, platelet count 147,000.    The patient did have a chest x-ray done which showed interval development of left mid and lower lung heterogeneous pulmonary opacities, concerning for pneumonia.   ASSESSMENT AND PLAN: This is an 81 year old male with history of advanced dementia, hypertension, history of coronary artery disease, status post bypass, gastroesophageal reflux disease who presents to the hospital with cough, congestion, fever, noted to have a pneumonia. 1.  Sepsis: The patient presented with fever of 101, leukocytosis, chest x-ray findings suggestive of pneumonia; therefore, this is the likely diagnosis. I will start the patient on some gentle IV fluids, give IV Levaquin for the pneumonia, follow blood and sputum cultures, follow hemodynamics. He is currently stable. 2.  Pneumonia, likely community-acquired pneumonia: There was also some concern of possible aspiration given his dementia. For now, I will treat the patient with IV Levaquin. Follow sputum and blood cultures. We will get a speech evaluation in the morning. 3.  Leukocytosis: I suspect this is secondary to the pneumonia. Follow white cell count after IV antibiotic therapy.  4.  Hypertension, presently hemodynamically stable: Continue atenolol. 5.  History of dementia with behavioral disturbance: We will place him on p.r.n. Haldol.   CODE STATUS: The patient is a full code.   TIME SPENT ON ADMISSION: Forty-five minutes.    ____________________________ Rolly Pancake. Cherlynn Kaiser, MD vjs:TM D: 10/29/2014 15:39:34 ET T: 10/29/2014 16:11:28 ET JOB#: [id number]  cc: Rolly Pancake. Cherlynn Kaiser, MD, <Dictator> Houston Siren MD ELECTRONICALLY SIGNED 11/15/2014 15:30

## 2015-01-14 NOTE — Discharge Summary (Signed)
PATIENT NAME:  Tom Sanders, Tom Sanders MR#:  [id number] DATE OF BIRTH:  1930/06/25  DATE OF ADMISSION:  10/29/2014 DATE OF DISCHARGE:  11/01/2014   ADMITTING PHYSICIAN: Hilda LiasVivek Sainani, MD  DISCHARGING PHYSICIAN: Enid Baasadhika Tiersa Dayley, MD   PRIMARY CARE PHYSICIAN: [facility]  CONSULTATIONS IN THE HOSPITAL: None.   DISCHARGE DIAGNOSES:  1.  Sepsis.  2.  Left lower lobe pneumonia.  3.  Dementia with behavioral disturbances, nonverbal at baseline and noninteractive.  4.  Hypertension.   DISCHARGE HOME MEDICATIONS:  1.  Omeprazole 20 mg p.o. daily.  2.  Aspirin 81 mg p.o. daily. 3.  Prenatal vitamins 1 tablet p.o. daily.  4.  MiraLAX powder 1 cupful daily as needed for constipation.  5.  Vitamin D3 50,000 International Units once a month.  6.  Atenolol 25 mg p.o. daily.  7.  Levaquin 750 mg p.o. daily for 4 more days.  DISCHARGE HOME OXYGEN: None.   DISCHARGE DIET: Regular diet with mechanical soft and pureed diet with thin liquids and aspiration precautions recommended.   DISCHARGE ACTIVITY: As tolerated.  FOLLOWUP INSTRUCTIONS:  PCP follow-up in 1 week.   LABORATORY AND IMAGING STUDIES PRIOR TO DISCHARGE: WBC 9.8, hemoglobin 11.4, hematocrit 33.5, and platelet count 145,000.   Sodium 137, potassium 3.8, chloride 105, bicarbonate 25, BUN 18, creatinine 1.1, glucose 98, and calcium of 8.9. Blood cultures on admission remained negative. Chest x-ray which showed left lower lobe infiltrate showed improvement prior to discharge.  BRIEF HOSPITAL COURSE: Tom Sanders is an 81 year old African American male with past medical history significant for advanced dementia with behavioral disturbances, nonverbal and noninteractive at baseline being cared for at home by wife and his daughter was presenting to the hospital secondary to high-grade fever, cough, congestion, and upper airway rattling.  1. Sepsis secondary to left lower lobe pneumonia as noted on x-ray. Seems community-acquired. Blood  cultures are negative. Started on Levaquin with improvement in the follow-up chest x-ray. Swallow evaluation was done to make sure the patient is not aspirating. The patient has behavioral issues also and so hard to swallow his food; sometimes pockets in mouth for long time. however, there are no signs of aspiration. Aspiration precautions were recommended. His white count has improved and the patient was hemodynamically stable.  2. All his other home medications are being continued without any changes.   DISCHARGE CONDITION: Guarded with poor long-term prognosis.   DISCHARGE DISPOSITION: Home.   TIME SPENT ON DISCHARGE: 40 minutes.    ____________________________ Enid Baasadhika Glendal Cassaday, MD rk:mc Sanders: 11/01/2014 15:37:04 ET T: 11/01/2014 16:16:21 ET JOB#: [id number]  cc: Enid Baasadhika Hridhaan Yohn, MD, <Dictator> [facility]  Enid BaasADHIKA Lalana Wachter MD ELECTRONICALLY SIGNED 11/30/2014 16:56

## 2016-01-14 DEATH — deceased
